# Patient Record
Sex: Female | Born: 1935 | Race: Black or African American | Hispanic: No | Smoking: Never smoker
Health system: Southern US, Community
[De-identification: ages and names within clinical notes are randomized; demographics above are authoritative.]

## PROBLEM LIST (undated history)

## (undated) DIAGNOSIS — I2699 Other pulmonary embolism without acute cor pulmonale: Secondary | ICD-10-CM

## (undated) DIAGNOSIS — E039 Hypothyroidism, unspecified: Secondary | ICD-10-CM

## (undated) DIAGNOSIS — I10 Essential (primary) hypertension: Secondary | ICD-10-CM

## (undated) DIAGNOSIS — I214 Non-ST elevation (NSTEMI) myocardial infarction: Secondary | ICD-10-CM

## (undated) DIAGNOSIS — K5790 Diverticulosis of intestine, part unspecified, without perforation or abscess without bleeding: Secondary | ICD-10-CM

## (undated) DIAGNOSIS — N189 Chronic kidney disease, unspecified: Secondary | ICD-10-CM

## (undated) DIAGNOSIS — R55 Syncope and collapse: Secondary | ICD-10-CM

## (undated) DIAGNOSIS — M069 Rheumatoid arthritis, unspecified: Secondary | ICD-10-CM

## (undated) DIAGNOSIS — R0789 Other chest pain: Secondary | ICD-10-CM

## (undated) DIAGNOSIS — K2211 Ulcer of esophagus with bleeding: Secondary | ICD-10-CM

## (undated) DIAGNOSIS — I350 Nonrheumatic aortic (valve) stenosis: Secondary | ICD-10-CM

## (undated) DIAGNOSIS — Z950 Presence of cardiac pacemaker: Secondary | ICD-10-CM

## (undated) DIAGNOSIS — D500 Iron deficiency anemia secondary to blood loss (chronic): Secondary | ICD-10-CM

## (undated) DIAGNOSIS — K922 Gastrointestinal hemorrhage, unspecified: Secondary | ICD-10-CM

## (undated) DIAGNOSIS — I509 Heart failure, unspecified: Secondary | ICD-10-CM

## (undated) DIAGNOSIS — I2510 Atherosclerotic heart disease of native coronary artery without angina pectoris: Secondary | ICD-10-CM

## (undated) DIAGNOSIS — F0390 Unspecified dementia without behavioral disturbance: Secondary | ICD-10-CM

## (undated) DIAGNOSIS — D126 Benign neoplasm of colon, unspecified: Secondary | ICD-10-CM

## (undated) DIAGNOSIS — I5032 Chronic diastolic (congestive) heart failure: Secondary | ICD-10-CM

## (undated) DIAGNOSIS — E785 Hyperlipidemia, unspecified: Secondary | ICD-10-CM

## (undated) DIAGNOSIS — R011 Cardiac murmur, unspecified: Secondary | ICD-10-CM

## (undated) HISTORY — DX: Other chest pain: R07.89

## (undated) HISTORY — DX: Atherosclerotic heart disease of native coronary artery without angina pectoris: I25.10

## (undated) HISTORY — DX: Other pulmonary embolism without acute cor pulmonale: I26.99

## (undated) HISTORY — DX: Syncope and collapse: R55

## (undated) HISTORY — DX: Chronic diastolic (congestive) heart failure: I50.32

## (undated) HISTORY — DX: Unspecified dementia, unspecified severity, without behavioral disturbance, psychotic disturbance, mood disturbance, and anxiety: F03.90

## (undated) HISTORY — DX: Hyperlipidemia, unspecified: E78.5

## (undated) HISTORY — DX: Nonrheumatic aortic (valve) stenosis: I35.0

## (undated) HISTORY — PX: JOINT REPLACEMENT: SHX530

## (undated) HISTORY — DX: Diverticulosis of intestine, part unspecified, without perforation or abscess without bleeding: K57.90

## (undated) HISTORY — DX: Gastrointestinal hemorrhage, unspecified: K92.2

## (undated) HISTORY — PX: OTHER SURGICAL HISTORY: SHX169

## (undated) HISTORY — DX: Ulcer of esophagus with bleeding: K22.11

## (undated) HISTORY — PX: KNEE ARTHROSCOPY: SUR90

## (undated) HISTORY — DX: Hypothyroidism, unspecified: E03.9

## (undated) HISTORY — DX: Benign neoplasm of colon, unspecified: D12.6

## (undated) HISTORY — DX: Iron deficiency anemia secondary to blood loss (chronic): D50.0

## (undated) HISTORY — DX: Essential (primary) hypertension: I10

## (undated) HISTORY — PX: CATARACT EXTRACTION: SUR2

## (undated) HISTORY — DX: Rheumatoid arthritis, unspecified: M06.9

---

## 2000-04-27 ENCOUNTER — Encounter: Payer: Self-pay | Admitting: Internal Medicine

## 2000-04-27 ENCOUNTER — Ambulatory Visit (HOSPITAL_COMMUNITY): Admission: RE | Admit: 2000-04-27 | Discharge: 2000-04-27 | Payer: Self-pay | Admitting: Internal Medicine

## 2000-12-04 ENCOUNTER — Inpatient Hospital Stay (HOSPITAL_COMMUNITY): Admission: EM | Admit: 2000-12-04 | Discharge: 2000-12-08 | Payer: Self-pay | Admitting: Emergency Medicine

## 2000-12-04 ENCOUNTER — Encounter: Payer: Self-pay | Admitting: Emergency Medicine

## 2000-12-07 ENCOUNTER — Encounter: Payer: Self-pay | Admitting: Internal Medicine

## 2000-12-08 ENCOUNTER — Encounter: Payer: Self-pay | Admitting: Internal Medicine

## 2000-12-18 ENCOUNTER — Encounter: Payer: Self-pay | Admitting: Internal Medicine

## 2000-12-18 ENCOUNTER — Ambulatory Visit (HOSPITAL_COMMUNITY): Admission: RE | Admit: 2000-12-18 | Discharge: 2000-12-18 | Payer: Self-pay | Admitting: Internal Medicine

## 2001-05-22 ENCOUNTER — Encounter: Admission: RE | Admit: 2001-05-22 | Discharge: 2001-05-22 | Payer: Self-pay | Admitting: Internal Medicine

## 2001-05-22 ENCOUNTER — Encounter: Payer: Self-pay | Admitting: Internal Medicine

## 2001-05-31 ENCOUNTER — Other Ambulatory Visit: Admission: RE | Admit: 2001-05-31 | Discharge: 2001-05-31 | Payer: Self-pay | Admitting: Obstetrics and Gynecology

## 2002-05-27 ENCOUNTER — Encounter: Payer: Self-pay | Admitting: Internal Medicine

## 2002-05-27 ENCOUNTER — Ambulatory Visit (HOSPITAL_COMMUNITY): Admission: RE | Admit: 2002-05-27 | Discharge: 2002-05-27 | Payer: Self-pay | Admitting: Internal Medicine

## 2003-02-04 ENCOUNTER — Ambulatory Visit (HOSPITAL_COMMUNITY): Admission: RE | Admit: 2003-02-04 | Discharge: 2003-02-04 | Payer: Self-pay | Admitting: Internal Medicine

## 2003-02-04 ENCOUNTER — Encounter: Payer: Self-pay | Admitting: Internal Medicine

## 2003-03-03 ENCOUNTER — Encounter: Payer: Self-pay | Admitting: Orthopedic Surgery

## 2003-03-03 ENCOUNTER — Ambulatory Visit (HOSPITAL_COMMUNITY): Admission: RE | Admit: 2003-03-03 | Discharge: 2003-03-03 | Payer: Self-pay | Admitting: Orthopedic Surgery

## 2004-12-27 ENCOUNTER — Ambulatory Visit (HOSPITAL_COMMUNITY): Admission: RE | Admit: 2004-12-27 | Discharge: 2004-12-27 | Payer: Self-pay | Admitting: Internal Medicine

## 2005-02-21 ENCOUNTER — Emergency Department (HOSPITAL_COMMUNITY): Admission: EM | Admit: 2005-02-21 | Discharge: 2005-02-21 | Payer: Self-pay | Admitting: Emergency Medicine

## 2005-02-28 ENCOUNTER — Emergency Department (HOSPITAL_COMMUNITY): Admission: EM | Admit: 2005-02-28 | Discharge: 2005-02-28 | Payer: Self-pay | Admitting: *Deleted

## 2005-03-05 ENCOUNTER — Emergency Department (HOSPITAL_COMMUNITY): Admission: EM | Admit: 2005-03-05 | Discharge: 2005-03-05 | Payer: Self-pay | Admitting: Emergency Medicine

## 2005-11-15 ENCOUNTER — Emergency Department (HOSPITAL_COMMUNITY): Admission: EM | Admit: 2005-11-15 | Discharge: 2005-11-15 | Payer: Self-pay | Admitting: Emergency Medicine

## 2006-06-13 ENCOUNTER — Emergency Department (HOSPITAL_COMMUNITY): Admission: EM | Admit: 2006-06-13 | Discharge: 2006-06-13 | Payer: Self-pay | Admitting: Emergency Medicine

## 2006-06-16 ENCOUNTER — Emergency Department (HOSPITAL_COMMUNITY): Admission: EM | Admit: 2006-06-16 | Discharge: 2006-06-16 | Payer: Self-pay | Admitting: Emergency Medicine

## 2007-02-28 ENCOUNTER — Ambulatory Visit (HOSPITAL_COMMUNITY): Admission: RE | Admit: 2007-02-28 | Discharge: 2007-02-28 | Payer: Self-pay | Admitting: Internal Medicine

## 2007-10-06 ENCOUNTER — Emergency Department (HOSPITAL_COMMUNITY): Admission: EM | Admit: 2007-10-06 | Discharge: 2007-10-06 | Payer: Self-pay | Admitting: Family Medicine

## 2008-06-14 ENCOUNTER — Emergency Department (HOSPITAL_COMMUNITY): Admission: EM | Admit: 2008-06-14 | Discharge: 2008-06-14 | Payer: Self-pay | Admitting: *Deleted

## 2008-06-26 ENCOUNTER — Ambulatory Visit (HOSPITAL_COMMUNITY): Admission: RE | Admit: 2008-06-26 | Discharge: 2008-06-26 | Payer: Self-pay | Admitting: Internal Medicine

## 2009-05-05 ENCOUNTER — Emergency Department (HOSPITAL_COMMUNITY): Admission: EM | Admit: 2009-05-05 | Discharge: 2009-05-05 | Payer: Self-pay | Admitting: Emergency Medicine

## 2009-05-07 ENCOUNTER — Inpatient Hospital Stay (HOSPITAL_COMMUNITY): Admission: EM | Admit: 2009-05-07 | Discharge: 2009-05-09 | Payer: Self-pay | Admitting: Emergency Medicine

## 2009-05-07 ENCOUNTER — Ambulatory Visit: Payer: Self-pay | Admitting: Cardiology

## 2009-05-08 ENCOUNTER — Encounter (INDEPENDENT_AMBULATORY_CARE_PROVIDER_SITE_OTHER): Payer: Self-pay | Admitting: Internal Medicine

## 2009-06-09 ENCOUNTER — Ambulatory Visit: Payer: Self-pay | Admitting: Diagnostic Radiology

## 2009-06-09 ENCOUNTER — Emergency Department (HOSPITAL_BASED_OUTPATIENT_CLINIC_OR_DEPARTMENT_OTHER): Admission: EM | Admit: 2009-06-09 | Discharge: 2009-06-09 | Payer: Self-pay | Admitting: Emergency Medicine

## 2009-07-21 HISTORY — PX: OTHER SURGICAL HISTORY: SHX169

## 2009-07-29 ENCOUNTER — Ambulatory Visit: Payer: Self-pay | Admitting: Internal Medicine

## 2009-07-29 ENCOUNTER — Inpatient Hospital Stay (HOSPITAL_COMMUNITY): Admission: EM | Admit: 2009-07-29 | Discharge: 2009-08-06 | Payer: Self-pay | Admitting: Emergency Medicine

## 2009-07-30 ENCOUNTER — Ambulatory Visit: Payer: Self-pay | Admitting: Vascular Surgery

## 2009-07-30 ENCOUNTER — Encounter (INDEPENDENT_AMBULATORY_CARE_PROVIDER_SITE_OTHER): Payer: Self-pay | Admitting: Internal Medicine

## 2009-08-03 ENCOUNTER — Ambulatory Visit: Payer: Self-pay | Admitting: Internal Medicine

## 2009-08-04 ENCOUNTER — Encounter (INDEPENDENT_AMBULATORY_CARE_PROVIDER_SITE_OTHER): Payer: Self-pay | Admitting: Internal Medicine

## 2009-08-04 ENCOUNTER — Encounter: Payer: Self-pay | Admitting: Internal Medicine

## 2009-08-06 ENCOUNTER — Encounter: Payer: Self-pay | Admitting: Internal Medicine

## 2009-08-07 ENCOUNTER — Encounter: Payer: Self-pay | Admitting: Cardiovascular Disease

## 2009-08-07 DIAGNOSIS — R55 Syncope and collapse: Secondary | ICD-10-CM

## 2009-08-07 DIAGNOSIS — M069 Rheumatoid arthritis, unspecified: Secondary | ICD-10-CM | POA: Insufficient documentation

## 2009-08-07 DIAGNOSIS — I359 Nonrheumatic aortic valve disorder, unspecified: Secondary | ICD-10-CM | POA: Insufficient documentation

## 2009-08-07 DIAGNOSIS — D500 Iron deficiency anemia secondary to blood loss (chronic): Secondary | ICD-10-CM

## 2009-08-07 DIAGNOSIS — I2699 Other pulmonary embolism without acute cor pulmonale: Secondary | ICD-10-CM

## 2009-08-07 DIAGNOSIS — K2211 Ulcer of esophagus with bleeding: Secondary | ICD-10-CM

## 2009-08-07 DIAGNOSIS — D126 Benign neoplasm of colon, unspecified: Secondary | ICD-10-CM

## 2009-08-07 DIAGNOSIS — I5032 Chronic diastolic (congestive) heart failure: Secondary | ICD-10-CM

## 2009-08-07 DIAGNOSIS — I10 Essential (primary) hypertension: Secondary | ICD-10-CM

## 2009-08-10 ENCOUNTER — Telehealth (INDEPENDENT_AMBULATORY_CARE_PROVIDER_SITE_OTHER): Payer: Self-pay | Admitting: *Deleted

## 2009-08-11 ENCOUNTER — Encounter: Payer: Self-pay | Admitting: Internal Medicine

## 2009-08-11 ENCOUNTER — Encounter: Payer: Self-pay | Admitting: Cardiovascular Disease

## 2009-08-11 LAB — CONVERTED CEMR LAB
POC INR: 2.9
Prothrombin Time: 20.6 s

## 2009-08-13 ENCOUNTER — Encounter (INDEPENDENT_AMBULATORY_CARE_PROVIDER_SITE_OTHER): Payer: Self-pay | Admitting: *Deleted

## 2009-08-16 ENCOUNTER — Encounter: Payer: Self-pay | Admitting: Internal Medicine

## 2009-08-16 ENCOUNTER — Inpatient Hospital Stay (HOSPITAL_COMMUNITY): Admission: EM | Admit: 2009-08-16 | Discharge: 2009-08-31 | Payer: Self-pay | Admitting: Emergency Medicine

## 2009-08-19 ENCOUNTER — Telehealth: Payer: Self-pay | Admitting: Internal Medicine

## 2009-08-28 ENCOUNTER — Encounter: Payer: Self-pay | Admitting: Internal Medicine

## 2009-08-31 ENCOUNTER — Encounter: Payer: Self-pay | Admitting: Internal Medicine

## 2009-09-01 ENCOUNTER — Telehealth (INDEPENDENT_AMBULATORY_CARE_PROVIDER_SITE_OTHER): Payer: Self-pay | Admitting: *Deleted

## 2009-09-08 ENCOUNTER — Ambulatory Visit: Payer: Self-pay | Admitting: Cardiology

## 2009-09-08 ENCOUNTER — Inpatient Hospital Stay (HOSPITAL_COMMUNITY): Admission: EM | Admit: 2009-09-08 | Discharge: 2009-09-09 | Payer: Self-pay | Admitting: Emergency Medicine

## 2009-09-09 ENCOUNTER — Encounter (INDEPENDENT_AMBULATORY_CARE_PROVIDER_SITE_OTHER): Payer: Self-pay | Admitting: Family Medicine

## 2009-09-09 ENCOUNTER — Encounter: Payer: Self-pay | Admitting: Internal Medicine

## 2009-09-10 ENCOUNTER — Encounter: Payer: Self-pay | Admitting: Cardiology

## 2009-09-18 ENCOUNTER — Encounter: Payer: Self-pay | Admitting: Internal Medicine

## 2009-09-18 ENCOUNTER — Encounter: Payer: Self-pay | Admitting: Cardiology

## 2009-09-21 ENCOUNTER — Ambulatory Visit: Payer: Self-pay | Admitting: Cardiology

## 2009-09-21 ENCOUNTER — Encounter: Payer: Self-pay | Admitting: Physician Assistant

## 2009-09-21 ENCOUNTER — Ambulatory Visit: Payer: Self-pay | Admitting: Internal Medicine

## 2009-09-21 DIAGNOSIS — F068 Other specified mental disorders due to known physiological condition: Secondary | ICD-10-CM | POA: Insufficient documentation

## 2009-09-21 DIAGNOSIS — R197 Diarrhea, unspecified: Secondary | ICD-10-CM | POA: Insufficient documentation

## 2009-09-21 DIAGNOSIS — R1319 Other dysphagia: Secondary | ICD-10-CM

## 2009-09-28 LAB — CONVERTED CEMR LAB: Vitamin B-12: 329 pg/mL (ref 211–911)

## 2009-10-05 ENCOUNTER — Encounter: Payer: Self-pay | Admitting: Internal Medicine

## 2009-10-06 ENCOUNTER — Ambulatory Visit (HOSPITAL_COMMUNITY): Admission: RE | Admit: 2009-10-06 | Discharge: 2009-10-06 | Payer: Self-pay | Admitting: Internal Medicine

## 2009-11-24 ENCOUNTER — Ambulatory Visit: Payer: Self-pay | Admitting: Internal Medicine

## 2009-11-24 DIAGNOSIS — R42 Dizziness and giddiness: Secondary | ICD-10-CM

## 2010-03-10 ENCOUNTER — Encounter: Payer: Self-pay | Admitting: Internal Medicine

## 2010-03-10 ENCOUNTER — Ambulatory Visit: Payer: Self-pay | Admitting: Internal Medicine

## 2010-03-30 IMAGING — CT CT ABD-PELV W/ CM
2 of 5 series · 17 of 46 positions shown, 19 images · IV contrast (APPLIED)
Comparison: 05/05/2009

CLINICAL DATA: Diffuse abdominal pain.  Vomiting.  Weakness.

CT ABDOMEN AND PELVIS WITH CONTRAST
TECHNIQUE: Multidetector CT imaging of the abdomen and pelvis was
performed following the standard protocol during bolus
administration of intravenous contrast.
Contrast: 100 ml [id number]

[Series 2: abd/pelvis 5.0 b31f · axial · 0.77mm/px · z∈[-366,-36]mm · 14 of 74 slices shown, 16 images]
[im 4/74  soft-tissue]
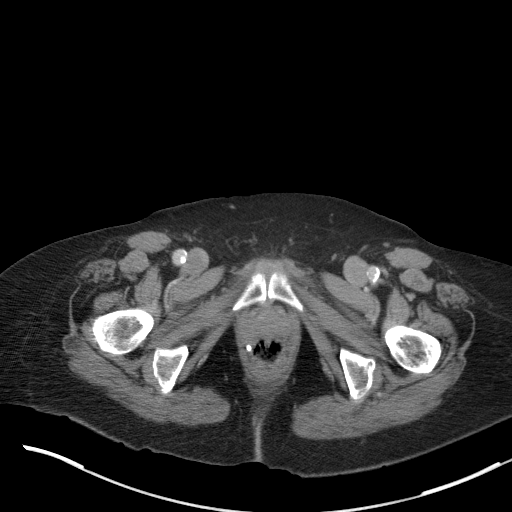
[im 4/74  bone]
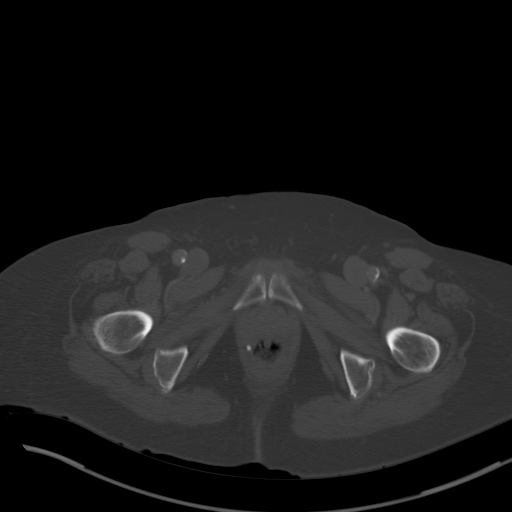
[im 11/74  soft-tissue]
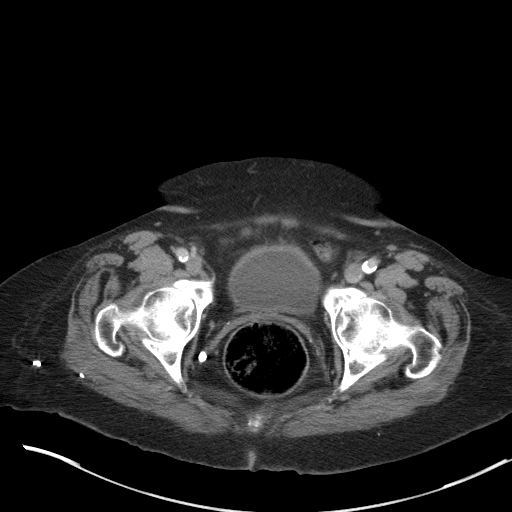
[im 14/74  soft-tissue]
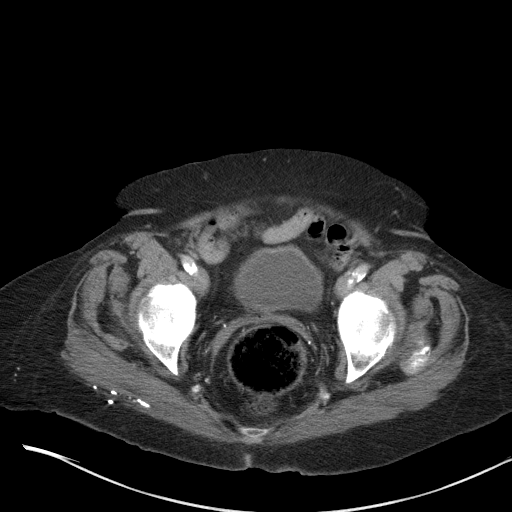
[im 21/74  soft-tissue]
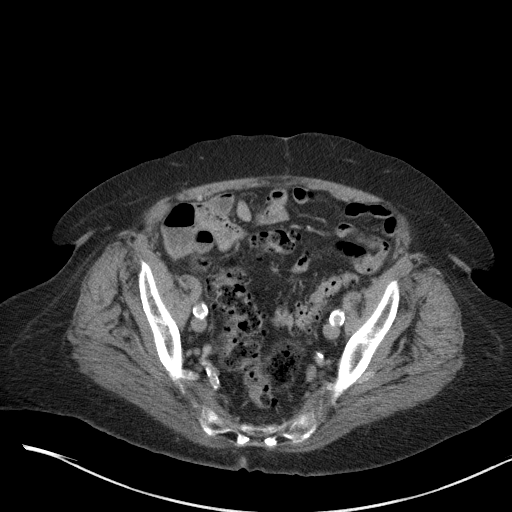
[im 25/74  soft-tissue]
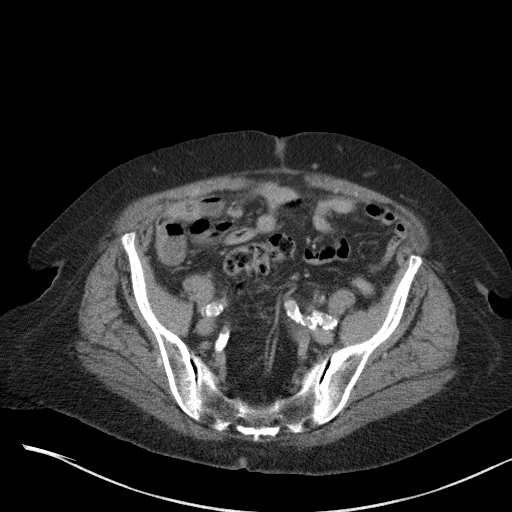
[im 28/74  soft-tissue]
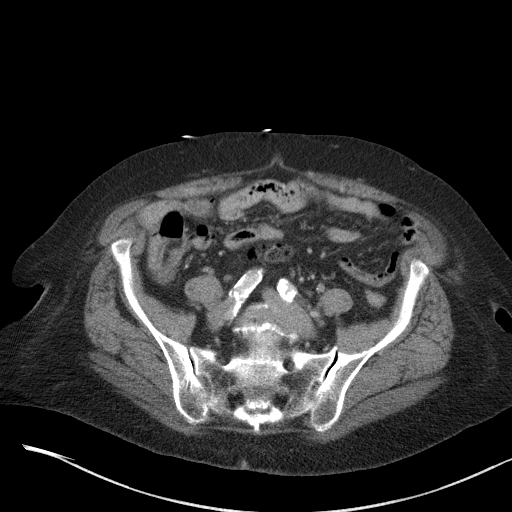
[im 35/74  soft-tissue]
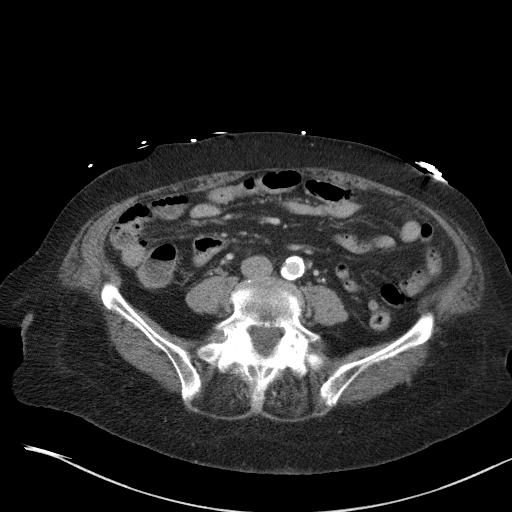
[im 39/74  soft-tissue]
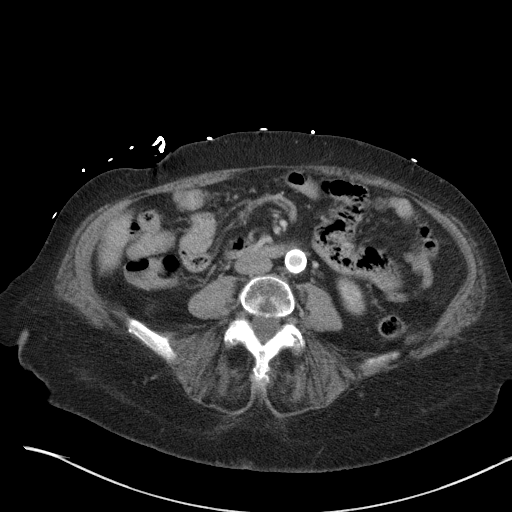
[im 46/74  soft-tissue]
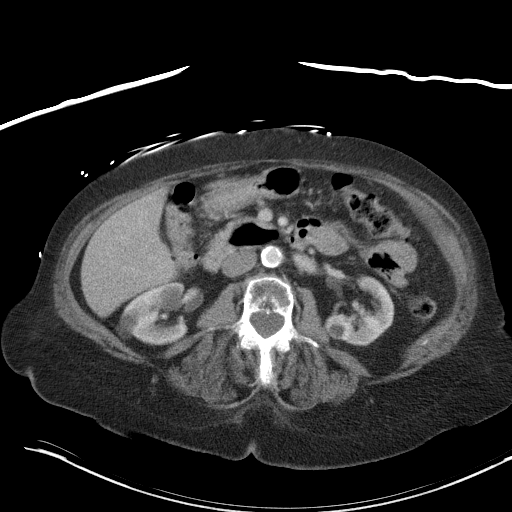
[im 46/74  bone]
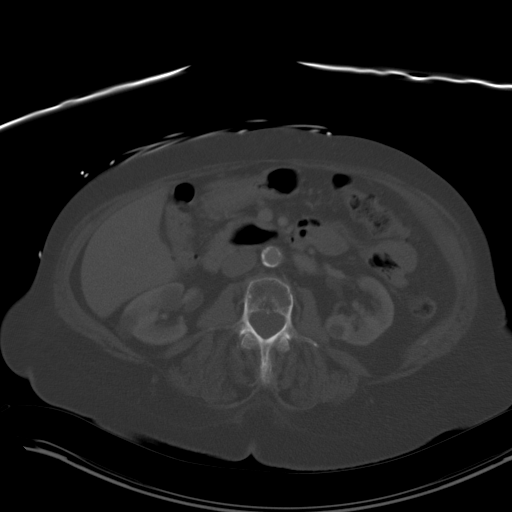
[im 49/74  soft-tissue]
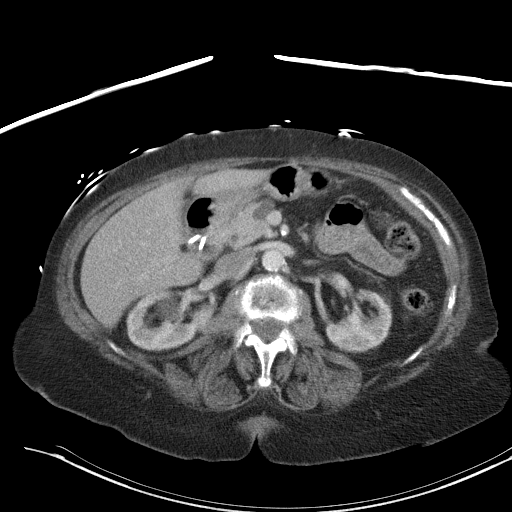
[im 56/74  soft-tissue]
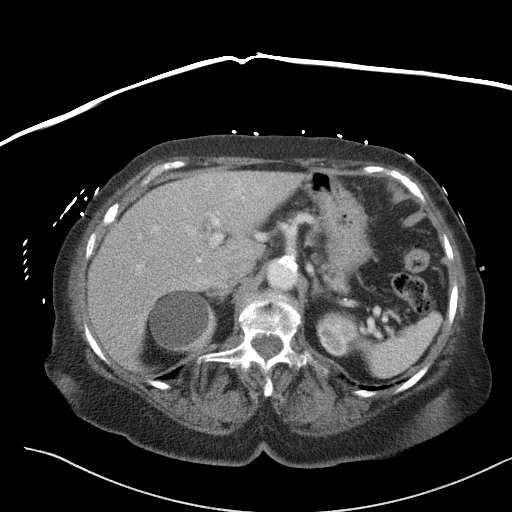
[im 60/74  soft-tissue]
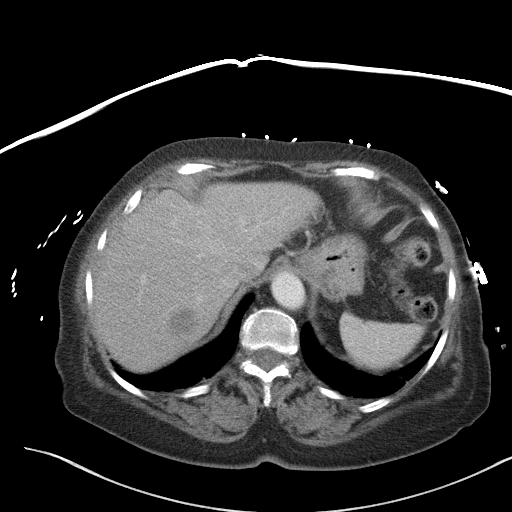
[im 63/74  soft-tissue]
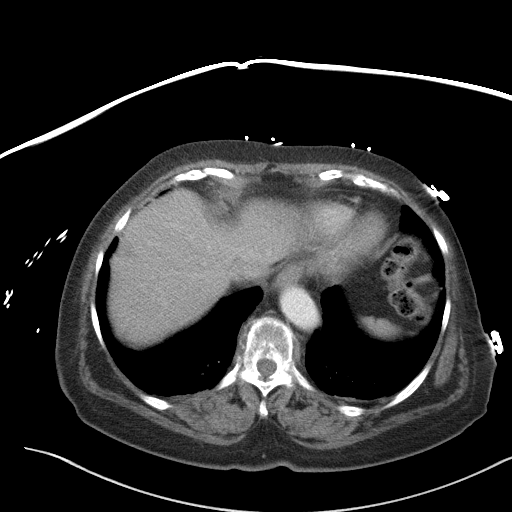
[im 70/74  soft-tissue]
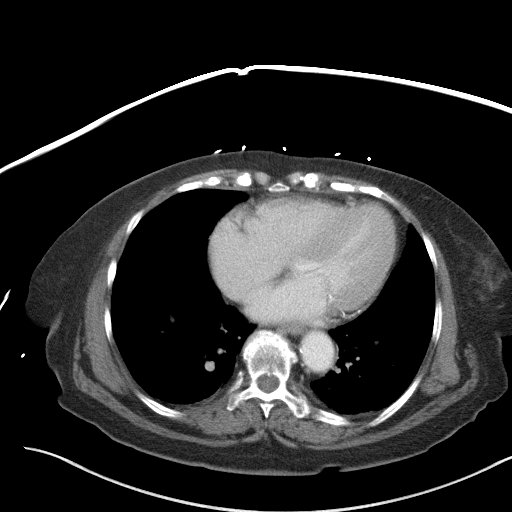

[Series 5: abd/pelvis 3.0 coronal · coronal · 0.76mm/px · 3 of 89 slices shown]
[im 30/89  soft-tissue]
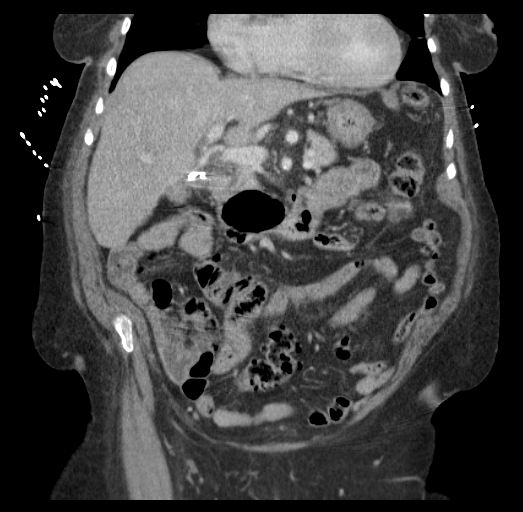
[im 40/89  soft-tissue]
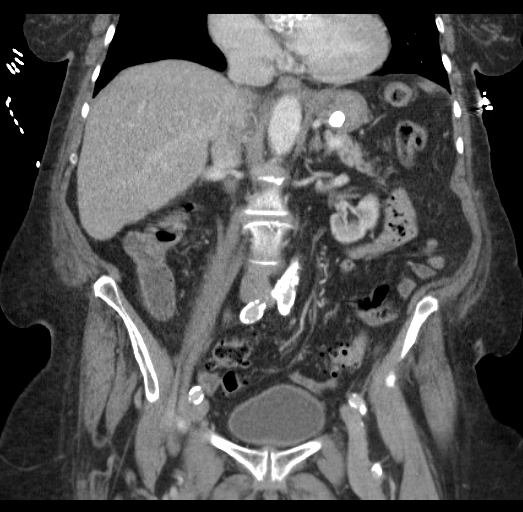
[im 49/89  soft-tissue]
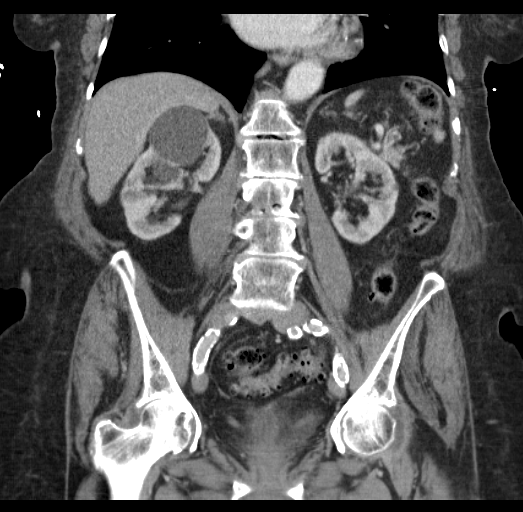

[17 of 46 positions shown; findings below may reference images not displayed]

FINDINGS: The liver, spleen, and adrenal glands are normal
appearance.  Bilateral renal cysts remains stable there is no
evidence of renal mass or hydronephrosis.

A small cystic lesion is again seen in the pancreatic body
measuring 1.8 x 0.9 cm.  This shows no significant change since
recent noncontrast CT.  No complex or enhancing soft tissue
component is seen there is no evidence of peripancreatic
inflammatory changes or pancreatic ductal dilatation.

No other soft tissue masses or adenopathy identified within the
abdomen or pelvis.  Previous hysterectomy noted.  Severe
diverticulosis of the sigmoid colon is seen, however there is no
evidence of diverticulitis or other inflammatory process.  No
abnormal fluid collections are seen.
IMPRESSION: 1.  No acute findings.
2.  1.8 cm simple appearing cystic lesion in the pancreatic body
with benign features.  Imaging followup is recommended in 12
months, preferably with abdomen MRI without and with contrast.
3. Sigmoid diverticulosis.  No evidence of diverticulitis.

This recommendation follows ACR consensus guidelines:  "Managing
Incidental Findings on Abdominal CT:  White Paper of the ACR
Incidental Findings Committee".  [HOSPITAL] [id number];[DATE]

## 2010-06-22 NOTE — Medication Information (Signed)
Summary: Coumadin Clinic  Anticoagulant Therapy  Managed by: Cloyde Reams, RN, BSN Referring MD: Dr Graciela Husbands PCP: Dr Margaretmary Bayley Supervising MD: Eden Emms MD, Theron Arista Indication 1: Pulmonary Embolism Lab Used: [facility] [place] Site: [address] PT 20.6 INR POC 2.9 INR RANGE 2 - 3    Bleeding/hemorrhagic complications: no     Any changes in medication regimen? no     Any missed doses?: no        Comments: Pt takes coumadin in am and has already taken today's dosage.    Allergies (verified): No Known Drug Allergies  Anticoagulation Management History:      Her anticoagulation is being managed by telephone today.  Positive risk factors for bleeding include an age of 75 years or older.  The bleeding index is 'intermediate risk'.  Positive CHADS2 values include History of CHF, History of HTN, and Age > 75 years old.  Prothrombin time is 20.6.  Anticoagulation responsible provider: Eden Emms MD, Theron Arista.  INR POC: 2.9.    Anticoagulation Management Assessment/Plan:      The patient's current anticoagulation dose is Warfarin sodium 5 mg tabs: Use as directed by Anticoagulation Clinic.  The target INR is 2.0-3.0.  The next INR is due 08/17/2009.  Anticoagulation instructions were given to caregiver in home.  Results were reviewed/authorized by Cloyde Reams, RN, BSN.  She was notified by Cloyde Reams RN.         Prior Anticoagulation Instructions: INR 1.8 Hospital dosing was:  3/12:  5 mg, 3/13: INR 0.98 dose=5 mg, 3/14: INR 1.08 no dose, INR 1.18 no dose, 3/16 INR 1.29 dose = 5 mg, 3/17 INR 1.43 dose = 5 mg.    Order to [facility] to give 5 mg today (3.18), continue lovenox two times a day, and recheck 3.19.  She will call result to on-call CARDS.      Current Anticoagulation Instructions: INR 2.9  Called spoke with pt's caregiver, advised to decr pt's dosage to 1 tablet daily except 1/2 tablet on Wednesdays.  Will recheck on 08/17/09. Called spoke with Noreene Larsson, [facility] advised  to recheck on 08/17/09.

## 2010-06-22 NOTE — Medication Information (Signed)
Summary: Coumadin Clinic  Anticoagulant Therapy  Managed by: Shelby Dubin, PharmD, BCPS, CPP Supervising MD: Clifton James MD, Cristal Deer Indication 1: Pulmonary Embolism Lab Used: [facility] [place] Site: [address] INR POC 1.8 INR RANGE 2 - 3  Dietary changes: no    Health status changes: no    Bleeding/hemorrhagic complications: no    Recent/future hospitalizations: yes       Details: d/c 3/17 s/p PE--  Any changes in medication regimen? yes       Details: d/cd home on warfarin 5 mg tabs and lovenox 85 mg Ada bid for 5 days.    Recent/future dental: no  Any missed doses?: no       Is patient compliant with meds? yes      Comments: call from [facility]--950 am 3/18.  Current Problems (verified): 1)  Chronic Diastolic Heart Failure  (ICD-428.32) 2)  Rheumatoid Arthritis  (ICD-714.0) 3)  Esophageal Ulcer, With Bleeding  (ICD-530.21) 4)  Pe  (ICD-415.19) 5)  Aortic Stenosis  (ICD-424.1) 6)  Essential Hypertension, Benign  (ICD-401.1) 7)  Tubulovillous Adenoma, Colon  (ICD-211.3) 8)  Iron Deficiency Anemia Secondary To Blood Loss  (ICD-280.0) 9)  Syncope  (ICD-780.2)  Current Medications (verified): 1)  Amlodipine Besylate 5 Mg Tabs (Amlodipine Besylate) .... Take One Tablet By Mouth Two Times A Day 2)  Lovenox 100 Mg/ml Soln (Enoxaparin Sodium) .... Inject 0.85 Mls Subcutaneously Into Abdomen Twice A Day--[facility] Administers. 3)  Protonix 40 Mg Tbec (Pantoprazole Sodium) .Marland Kitchen.. 1 By Mouth Two Times A Day Before Meals 4)  Warfarin Sodium 5 Mg Tabs (Warfarin Sodium) .... Use As Directed By Anticoagulation Clinic 5)  Accolate 20 Mg Tabs (Zafirlukast) .Marland Kitchen.. 1 By Mouth Two Times A Day 6)  Acetaminophen 500 Mg Caps (Acetaminophen) .... 2 Tabs By Mouth Two Times A Day 7)  Aricept 10 Mg Tabs (Donepezil Hcl) .Marland Kitchen.. 1 By Mouth Daily At Bedtime 8)  Lomotil 2.5-0.025 Mg Tabs (Diphenoxylate-Atropine) .Marland Kitchen.. 1 - 2 Tabs As Needed For Upset Stomach. 9)  Ferrous Sulfate 325 (65 Fe) Mg Tabs  (Ferrous Sulfate) .Marland Kitchen.. 1 By Mouth Daily 10)  Isosorbide Mononitrate Cr 30 Mg Xr24h-Tab (Isosorbide Mononitrate) .... Take One Tablet By Mouth Daily 11)  Labetalol Hcl 200 Mg Tabs (Labetalol Hcl) .... Take One Tablet By Mouth Twice A Day 12)  Furosemide 20 Mg Tabs (Furosemide) .... Take One Tablet By Mouth Every Other Day. 13)  Leflunomide 20 Mg Tabs (Leflunomide) .Marland Kitchen.. 1 By Mouth Daily. 14)  Lipitor 20 Mg Tabs (Atorvastatin Calcium) .... Take One Tablet By Mouth Daily At Bedtime. 15)  Potassium Chloride Crys Cr 20 Meq Cr-Tabs (Potassium Chloride Crys Cr) .... Take One Tablet By Mouth Every Other Day. 16)  Prednisone 5 Mg Tabs (Prednisone) .... 1.5 Tabs Daily. 17)  Synthroid 125 Mcg Tabs (Levothyroxine Sodium) .Marland Kitchen.. 1 By Mouth Daily. 18)  Systane 0.4-0.3 % Soln (Polyethyl Glycol-Propyl Glycol) .Marland Kitchen.. 1 Drop in Each Eye Daily As Needed.  Allergies (verified): No Known Drug Allergies  Anticoagulation Management History:      Her anticoagulation is being managed by telephone today.  Positive risk factors for bleeding include an age of 75 years or older.  The bleeding index is 'intermediate risk'.  Positive CHADS2 values include History of CHF, History of HTN, and Age > 17 years old.  Anticoagulation responsible provider: Clifton James MD, Cristal Deer.  INR POC: 1.8.    Anticoagulation Management Assessment/Plan:      The patient's current anticoagulation dose is Warfarin sodium 5  mg tabs: Use as directed by Anticoagulation Clinic.  The next INR is due 08/08/2009.  Results were reviewed/authorized by Shelby Dubin, PharmD, BCPS, CPP.  She was notified by Shelby Dubin PharmD, BCPS, CPP.         Current Anticoagulation Instructions: INR 1.8 Hospital dosing was:  3/12:  5 mg, 3/13: INR 0.98 dose=5 mg, 3/14: INR 1.08 no dose, INR 1.18 no dose, 3/16 INR 1.29 dose = 5 mg, 3/17 INR 1.43 dose = 5 mg.    Order to [facility] to give 5 mg today (3.18), continue lovenox two times a day, and recheck 3.19.  She will call result  to on-call CARDS.

## 2010-06-22 NOTE — Assessment & Plan Note (Signed)
Summary: eph/chest pain   Visit Type:  Follow-up Primary Provider:  Margaretmary Bayley, MD  CC:  chest pain, sob, dizziness, and edema in legs and feet.  History of Present Illness: This is an 77 year old, African American female with multiple medical problems, who is here for post hospital followup. She was recently diagnosed with severe aortic stenosis and is not a surgical candidate. She has history of syncope felt secondary to vasomotor problems, as well as recent GI bleed, and anemia. Dr. Graciela Husbands saw her in the hospital and thought she may need a loop recorder if she continues to have syncope. She has had trouble with bilateral pulmonary embolus treated with Coumadin. She then had a GI bleed on Coumadin and had an IVC filter placed. She is still on Coumadin because pulmonary recommended this but her INR was up to 5 last week. Dr. Margaretmary Bayley is managing this.  The patient's daughter stated that she's had 3 episodes  of near-syncope. Every morning, when her caregiver gives her a shower she comes out and becomes weak, dizzy, nauseated, and presyncopal. She never vomits and then the episode passes. This has happened 3 mornings in a row.  The patient complains of dyspnea on exertion.The patient also complains of right lower extremity edema and pain worse at night. The daughter, says she's complained of this ever since. The IVC filter was placed.  The patient also complains of left-sided chest pain. She describes it as a left sharp, shooting pain that occurs when she moves in a certain way.  Current Medications (verified): 1)  Amlodipine Besylate 5 Mg Tabs (Amlodipine Besylate) .... Take One Tablet By Mouth Two Times A Day 2)  Warfarin Sodium 5 Mg Tabs (Warfarin Sodium) .... Use As Directed By Anticoagulation Clinic Hold 3)  Accolate 20 Mg Tabs (Zafirlukast) .Marland Kitchen.. 1 By Mouth Two Times A Day 4)  Acetaminophen 500 Mg Caps (Acetaminophen) .... 2 Tabs By Mouth Two Times A Day 5)  Aricept 10 Mg Tabs  (Donepezil Hcl) .Marland Kitchen.. 1 By Mouth Daily At Bedtime 6)  Lomotil 2.5-0.025 Mg Tabs (Diphenoxylate-Atropine) .Marland Kitchen.. 1 - 2 Tabs As Needed For Upset Stomach. 7)  Ferrous Sulfate 325 (65 Fe) Mg Tabs (Ferrous Sulfate) .Marland Kitchen.. 1 By Mouth Daily 8)  Isosorbide Mononitrate Cr 30 Mg Xr24h-Tab (Isosorbide Mononitrate) .... Take One Tablet By Mouth Daily 9)  Labetalol Hcl 200 Mg Tabs (Labetalol Hcl) .... Take One Tablet By Mouth Twice A Day 10)  Furosemide 20 Mg Tabs (Furosemide) .... Take One Tablet By Mouth Every Other Day. 11)  Leflunomide 20 Mg Tabs (Leflunomide) .Marland Kitchen.. 1 By Mouth Daily. 12)  Lipitor 20 Mg Tabs (Atorvastatin Calcium) .... Take One Tablet By Mouth Daily At Bedtime. 13)  Potassium Chloride Crys Cr 20 Meq Cr-Tabs (Potassium Chloride Crys Cr) .... Take One Tablet By Mouth Every Other Day. 14)  Prednisone 5 Mg Tabs (Prednisone) .... 1.5 Tabs Daily. 15)  Synthroid 125 Mcg Tabs (Levothyroxine Sodium) .Marland Kitchen.. 1 By Mouth Daily. 16)  Systane 0.4-0.3 % Soln (Polyethyl Glycol-Propyl Glycol) .Marland Kitchen.. 1 Drop in Each Eye Daily As Needed. 17)  Nexium 40 Mg Cpdr (Esomeprazole Magnesium) .... Once Daily 18)  Vitamin D2 50000iu .... Weekly  Allergies: No Known Drug Allergies  Past History:  Past Medical History: Last updated: 09/21/2009 CHRONIC DIASTOLIC HEART FAILURE (ICD-428.32) RHEUMATOID ARTHRITIS (ICD-714.0) ESOPHAGEAL ULCER, WITH BLEEDING (ICD-530.21) 07/2009 Pulmonary Embolism AORTIC STENOSIS - Severe ESSENTIAL HYPERTENSION, BENIGN (ICD-401.1) TUBULOVILLOUS ADENOMA, COLON (ICD-211.3) IRON DEFICIENCY ANEMIA SECONDARY TO BLOOD LOSS (ICD-280.0) SYNCOPE (ICD-780.2) Musculoskeletal  chest pain Dementia Hypothyroidism Hyperlipidemia GI bleeds on Coumadin  Past Surgical History: Last updated: 09/21/2009 IVC filter 07/2009 Knee Arthroscopy Cataract Extraction  Review of Systems       see history of present illness  Vital Signs:  Patient profile:   77 year old female Height:      63 inches Weight:       177 pounds Pulse rate:   88 / minute Pulse (ortho):   90 / minute Pulse rhythm:   regular BP sitting:   142 / 80  (right arm) BP standing:   89 / 54  Vitals Entered By: Jacquelin Hawking, CMA (Sep 21, 2009 1:05 PM)  Serial Vital Signs/Assessments:  Time      Position  BP       Pulse  Resp  Temp     By 1:45      Lying RA  130/71   76                    Concetta Pfohl, CMA 1:45      Sitting   110/65   80                    Concetta Pfohl, CMA 1:45      Standing  89/54    90                    Concetta Pfohl, CMA  Comments: 1:45 pt was dizzy and weak upon standing. By: Jacquelin Hawking, CMA    Physical Exam  General:  Elderly, in no acute distress. Neck: slight increaseJVD, HJR, no Bruit, or thyroid enlargement Lungs: No tachypnea, clear without wheezing, rales, or rhonchi Cardiovascular: RRR, PMI not displaced, a harsh 3/6 systolic murmur at the right sternal border and left sternal border, decreased S2,no  gallops, bruit, thrill, or heave. Abdomen: BS normal. Soft without organomegaly, masses, lesions or tenderness. Extremities:+1 edema bilateral lower extremities, right greater than left, Good distal pulses bilateral SKin: Warm, no lesions or rashes  Musculoskeletal: No deformities Neuro: no focal signs    EKG  Procedure date:  09/21/2009  Findings:      normal sinus rhythm, with right bundle branch block  Impression & Recommendations:  Problem # 1:  SYNCOPE (ICD-780.2) This patient continues to have recurrent dizziness with presyncope especially after taking a shower or getting up from lying or sitting position. She was significantly orthostatic here in the office today. I am decreasing her labetalol to 100 mg b.i.d. and stopping her amlodipine. Her updated medication list for this problem includes:    Amlodipine Besylate 5 Mg Tabs (Amlodipine besylate) .Marland Kitchen... Take one tablet by mouth two times a day    Warfarin Sodium 5 Mg Tabs (Warfarin sodium) ..... Use as directed  by anticoagulation clinic hold    Isosorbide Mononitrate Cr 30 Mg Xr24h-tab (Isosorbide mononitrate) .Marland Kitchen... Take one tablet by mouth daily    Labetalol Hcl 200 Mg Tabs (Labetalol hcl) .Marland Kitchen... Take one tablet by mouth twice a day    Prednisone 5 Mg Tabs (Prednisone) .Marland Kitchen... 1.5 tabs daily.  The following medications were removed from the medication list:    Amlodipine Besylate 5 Mg Tabs (Amlodipine besylate) .Marland Kitchen... Take one tablet by mouth two times a day Her updated medication list for this problem includes:    Warfarin Sodium 5 Mg Tabs (Warfarin sodium) ..... Use as directed by anticoagulation clinic hold    Isosorbide Mononitrate Cr 30 Mg Xr24h-tab (  Isosorbide mononitrate) .Marland Kitchen... Take one tablet by mouth daily    Labetalol Hcl 200 Mg Tabs (Labetalol hcl) .Marland Kitchen... Take 1/2  tablet by mouth twice a day    Prednisone 5 Mg Tabs (Prednisone) .Marland Kitchen... 1.5 tabs daily.  The following medications were removed from the medication list:    Amlodipine Besylate 5 Mg Tabs (Amlodipine besylate) .Marland Kitchen... Take one tablet by mouth two times a day Her updated medication list for this problem includes:    Warfarin Sodium 5 Mg Tabs (Warfarin sodium) ..... Use as directed by anticoagulation clinic hold    Isosorbide Mononitrate Cr 30 Mg Xr24h-tab (Isosorbide mononitrate) .Marland Kitchen... Take one tablet by mouth daily    Labetalol Hcl 200 Mg Tabs (Labetalol hcl) .Marland Kitchen... Take one tablet by mouth twice a day    Prednisone 5 Mg Tabs (Prednisone) .Marland Kitchen... 1.5 tabs daily.  Problem # 2:  CHRONIC DIASTOLIC HEART FAILURE (ICD-428.32) Patient does have some leg edema, but lungs are clear and reluctant to increase her Lasix as she is orthostatic.on helping some of the edema will decrease with stopping her amlodipine. The following medications were removed from the medication list:    Amlodipine Besylate 5 Mg Tabs (Amlodipine besylate) .Marland Kitchen... Take one tablet by mouth two times a day Her updated medication list for this problem includes:    Warfarin  Sodium 5 Mg Tabs (Warfarin sodium) ..... Use as directed by anticoagulation clinic hold    Isosorbide Mononitrate Cr 30 Mg Xr24h-tab (Isosorbide mononitrate) .Marland Kitchen... Take one tablet by mouth daily    Labetalol Hcl 200 Mg Tabs (Labetalol hcl) .Marland Kitchen... Take 1/2  tablet by mouth twice a day    Furosemide 20 Mg Tabs (Furosemide) .Marland Kitchen... Take one tablet by mouth every other day.  Orders: EKG w/ Interpretation (93000)  The following medications were removed from the medication list:    Amlodipine Besylate 5 Mg Tabs (Amlodipine besylate) .Marland Kitchen... Take one tablet by mouth two times a day Her updated medication list for this problem includes:    Warfarin Sodium 5 Mg Tabs (Warfarin sodium) ..... Use as directed by anticoagulation clinic hold    Isosorbide Mononitrate Cr 30 Mg Xr24h-tab (Isosorbide mononitrate) .Marland Kitchen... Take one tablet by mouth daily    Labetalol Hcl 200 Mg Tabs (Labetalol hcl) .Marland Kitchen... Take 1/2  tablet by mouth twice a day    Furosemide 20 Mg Tabs (Furosemide) .Marland Kitchen... Take one tablet by mouth every other day.  Problem # 3:  AORTIC STENOSIS (ICD-424.1) Patient has severe aortic stenosis, but is nonoperative candidate Her updated medication list for this problem includes:    Isosorbide Mononitrate Cr 30 Mg Xr24h-tab (Isosorbide mononitrate) .Marland Kitchen... Take one tablet by mouth daily    Labetalol Hcl 200 Mg Tabs (Labetalol hcl) .Marland Kitchen... Take 1/2  tablet by mouth twice a day    Furosemide 20 Mg Tabs (Furosemide) .Marland Kitchen... Take one tablet by mouth every other day.  Problem # 4:  PE (ICD-415.19) Patient has bilateral pulmonary embolus, and recently had an IVC filter placed because of a GI bleed on Coumadin. Pulmonary recommends continuing Coumadin, but they're having trouble adjusting this. This is being followed by Dr. Margaretmary Bayley. INR was 5. Last week, and she is having it rechecked on Wednesday. Her updated medication list for this problem includes:    Warfarin Sodium 5 Mg Tabs (Warfarin sodium) ..... Use as  directed by anticoagulation clinic hold  Patient Instructions: 1)  Your physician has recommended you make the following change in your medication: Stop Amlodipine,  and  decrease Labetalol to 1/2 tablet twice a day. 2)  Your physician recommends that you schedule a follow-up appointment in: 2 weeks with Dr. Graciela Husbands.

## 2010-06-22 NOTE — Miscellaneous (Signed)
Summary: [facility]  [facility]   Imported By: Roderic Ovens 09/30/2009 11:11:44  _____________________________________________________________________  External Attachment:    Type:   Image     Comment:   External Document

## 2010-06-22 NOTE — Procedures (Signed)
Summary: Upper Endoscopy  Patient: [name] Note: All result statuses are Final unless otherwise noted.  Tests: (1) Upper Endoscopy (EGD)   EGD Upper Endoscopy       DONE     [facility]     [address]     [place], [place]  [address]           ENDOSCOPY PROCEDURE REPORT           PATIENT:  Penny, Tran  MR#:  [id number]     BIRTHDATE:  [birth year], 89 yrs. old  GENDER:  female           ENDOSCOPIST:  Iva Boop, MD, [facility]     Referred by:  [facility]           PROCEDURE DATE:  08/04/2009     PROCEDURE:  EGD with biopsy     ASA CLASS:  Class III     INDICATIONS:  FOBT + stool, iron deficiency anemia awaiting     warfarin with new bilateral PE's           MEDICATIONS:   There was residual sedation effect present from     prior procedure., Fentanyl 10 mcg IV, Versed 1 mg IV     TOPICAL ANESTHETIC:  Cetacaine Spray           DESCRIPTION OF PROCEDURE:   After the risks benefits and     alternatives of the procedure were thoroughly explained, informed     consent was obtained.  The [id number] ([id number]) and [id number]     ([id number]) endoscope was introduced through the mouth and advanced     to the second portion of the duodenum, without limitations.  The     instrument was slowly withdrawn as the mucosa was fully examined.     <<PROCEDUREIMAGES>>           An ulcer was found in the distal esophagus. approximately 2 cm     with adherent clots on the distal and proximal aspects, no     bleeding. With standard forceps, a biopsy was obtained and sent to     pathology. Biopsy of clean-based area. A submucosal nodule/bulge     in proximal stomach. About.15 cm max.  Otherwise the examination     was normal.    Retroflexed views revealed Retroflexion exam     demonstrated findings as previously described.    The scope was     then withdrawn from the patient and the procedure completed.           COMPLICATIONS:  None           ENDOSCOPIC  IMPRESSION:     1) Ulcer in the distal esophagus - probably a Mallory-weiss tear     as she told me she did vomit with the colonoscopy prep. There are     adherent clots but no current bleeding and with motion in the     esophagus and some difficulty desating her I did not attempt     therapy (most of these heal spontaneously).     2) Submucosal nodule/bulge in proximal stomach - (nothing seen     in this area on Jan 2011 CT abd)     3) Otherwise normal examination     RECOMMENDATIONS:     1) Lovenox but not warfarin yet     2) will discuss plans but need to see colon polyp pathology.  3) Bid PPI for now           REPEAT EXAM:  as needed           Iva Boop, MD, [facility]           CC:  Margaretmary Bayley, MD     The Patient           n.     eSIGNED:   Iva Boop at 08/04/2009 03:31 PM           Irene Shipper, [id number]  Note: An exclamation mark (!) indicates a result that was not dispersed into the flowsheet. Document Creation Date: 08/04/2009 3:40 PM _______________________________________________________________________  (1) Order result status: Final Collection or observation date-time: 08/04/2009 15:20 Requested date-time:  Receipt date-time:  Reported date-time:  Referring Physician:   Ordering Physician: Stan Head [phone]) Specimen Source:  Source: Launa Grill Order Number: [id number] Lab site:

## 2010-06-22 NOTE — Miscellaneous (Signed)
Summary: [facility]  [facility]   Imported By: Roderic Ovens 09/03/2009 15:23:48  _____________________________________________________________________  External Attachment:    Type:   Image     Comment:   External Document

## 2010-06-22 NOTE — Letter (Signed)
Summary: New Patient letter  [facility]  [address] [place], [place] [address]   Phone: [phone]  Fax: [phone]       08/13/2009 MRN: [id number]  [facility] [address] [place], [place]  [address]  [place]  Dear Ms. Muldrow,  Welcome to the Gastroenterology Division at [facility].    You are scheduled to see Dr. Leone Payor on 09-21-09 at 10:30a.m. on the 3rd floor at [facility], [address].  We ask that you try to arrive at our office 15 minutes prior to your appointment time to allow for check-in.  We would like you to complete the enclosed self-administered evaluation form prior to your visit and bring it with you on the day of your appointment.  We will review it with you.  Also, please bring a complete list of all your medications or, if you prefer, bring the medication bottles and we will list them.  Please bring your insurance card so that we may make a copy of it.  If your insurance requires a referral to see a specialist, please bring your referral form from your primary care physician.  Co-payments are due at the time of your visit and may be paid by cash, check or credit card.     Your office visit will consist of a consult with your physician (includes a physical exam), any laboratory testing he/she may order, scheduling of any necessary diagnostic testing (e.g. x-ray, ultrasound, CT-scan), and scheduling of a procedure (e.g. Endoscopy, Colonoscopy) if required.  Please allow enough time on your schedule to allow for any/all of these possibilities.    If you cannot keep your appointment, please call [phone] to cancel or reschedule prior to your appointment date.  This allows [place] the opportunity to schedule an appointment for another patient in need of care.  If you do not cancel or reschedule by 5 p.m. the business day prior to your appointment date, you will be charged a $50.00 late cancellation/no-show fee.    Thank you for choosing  [facility] for your medical needs.  We appreciate the opportunity to care for you.  Please visit [place] at our website  to learn more about our practice.                     Sincerely,                                                             The Gastroenterology Division

## 2010-06-22 NOTE — Assessment & Plan Note (Signed)
Summary: rov/sl   Primary Provider:  Margaretmary Bayley, MD  CC:  rov.  Pt reports dizziness.  This is frequent.  .  History of Present Illness: [name] is seen following a hospitalization in March for syncope occurring in the context of severe inoperable aortic stenosis with an aortic valve area of 0.6 and hypertension. It was my impression that she had vasomotor/orthostatic syncope. Her symptoms are somewhat ameliorated by reduction in blood pressure. the records were reviewed  The hospitalization was also complicated by DVT for which he was put on Coumadin. Management of this has been quite nightmarish with INRs ranging from 1-5. The plan is to discontinue it next week.  she also has significant and recurrent problems with orthostatic lightheadedness   She also has severe dementia.  Current Medications (verified): 1)  Warfarin Sodium 3 Mg Tabs (Warfarin Sodium) .... Take 1 Tablet On Tues, Thurs, Sat and Sun.  Take 2 Tablets All Other Days 2)  Accolate 20 Mg Tabs (Zafirlukast) .Marland Kitchen.. 1 By Mouth Two Times A Day 3)  Acetaminophen 500 Mg Caps (Acetaminophen) .... 2 Tabs By Mouth Two Times A Day 4)  Aricept 10 Mg Tabs (Donepezil Hcl) .Marland Kitchen.. 1 By Mouth Daily At Bedtime 5)  Lomotil 2.5-0.025 Mg Tabs (Diphenoxylate-Atropine) .Marland Kitchen.. 1 - 2 Tabs As Needed For Upset Stomach. 6)  Ferrous Sulfate 325 (65 Fe) Mg Tabs (Ferrous Sulfate) .Marland Kitchen.. 1 By Mouth Daily 7)  Isosorbide Mononitrate Cr 30 Mg Xr24h-Tab (Isosorbide Mononitrate) .... Take One Tablet By Mouth Daily 8)  Labetalol Hcl 200 Mg Tabs (Labetalol Hcl) .... Take 1/2  Tablet By Mouth Twice A Day 9)  Furosemide 20 Mg Tabs (Furosemide) .... Take One Tablet By Mouth Every Other Day. 10)  Leflunomide 20 Mg Tabs (Leflunomide) .Marland Kitchen.. 1 By Mouth Daily. 11)  Lipitor 20 Mg Tabs (Atorvastatin Calcium) .... Take One Tablet By Mouth Daily At Bedtime. 12)  Potassium Chloride Crys Cr 20 Meq Cr-Tabs (Potassium Chloride Crys Cr) .... Take One Tablet By Mouth Every Other  Day. 13)  Prednisone 5 Mg Tabs (Prednisone) .... 1.5 Tabs Daily. 14)  Synthroid 125 Mcg Tabs (Levothyroxine Sodium) .Marland Kitchen.. 1 By Mouth Daily. 15)  Systane 0.4-0.3 % Soln (Polyethyl Glycol-Propyl Glycol) .Marland Kitchen.. 1 Drop in Each Eye Daily As Needed. 16)  Nexium 40 Mg Cpdr (Esomeprazole Magnesium) .... Once Daily 17)  Vitamin D2 50000iu .... Weekly 18)  Vitamin C Cr 500 Mg Cr-Caps (Ascorbic Acid) .... Take One Capsule Once Daily 19)  Diphenoxylate-Atropine 2.5-0.025 Mg Tabs (Diphenoxylate-Atropine) .... Uad As Needed For Loose Stools  Allergies (verified): No Known Drug Allergies  Past History:  Past Medical History: Last updated: 09/21/2009 CHRONIC DIASTOLIC HEART FAILURE (ICD-428.32) RHEUMATOID ARTHRITIS (ICD-714.0) ESOPHAGEAL ULCER, WITH BLEEDING (ICD-530.21) 07/2009 Pulmonary Embolism AORTIC STENOSIS - Severe ESSENTIAL HYPERTENSION, BENIGN (ICD-401.1) TUBULOVILLOUS ADENOMA, COLON (ICD-211.3) IRON DEFICIENCY ANEMIA SECONDARY TO BLOOD LOSS (ICD-280.0) SYNCOPE (ICD-780.2) Musculoskeletal chest pain Dementia Hypothyroidism Hyperlipidemia GI bleeds on Coumadin  Past Surgical History: Last updated: 09/21/2009 IVC filter 07/2009 Knee Arthroscopy Cataract Extraction  Family History: Last updated: 09/21/2009 Family History of Breast Cancer:Daughter Family History of Esophageal Cancer:Daughter Family History of Prostate Cancer:Nephew Family History of Diabetes: daughter Family History of Heart Disease:   Social History: Last updated: 09/18/2009 The patient denies any smoking or any drug use.  She   currently lives at home with the caregiver and usually ambulates with a   walker.   Vital Signs:  Patient profile:   77 year old female Height:  63 inches Weight:      182 pounds BMI:     32.36 Pulse rate:   73 / minute Pulse rhythm:   regular BP sitting:   150 / 77  (right arm) Cuff size:   regular  Vitals Entered By: Judithe Modest CMA (November 24, 2009 11:17  AM)   Physical Exam  General:  Well developed, well nourished African American female appearing her stated age in no acute distress. Head:  normal HEENT Neck:  supple carotids were delayed Lungs:  clear Heart:  regular rate and rhythm with a 3/6 crescendo decrescendo murmur along theleft sternal border with a single S2 Abdomen:  soft and nontender Extremities:  without significant edema but tenderness Neurologic:  alert and oriented x1   Impression & Recommendations:  Problem # 1:  ORTHOSTATIC DIZZINESS (ICD-780.4) the patient has orthostatic dizziness. Her systolic blood pressure 100/50; her intracavitary pressure is way over 200. However, I think that her lightheadedness and the potential for fall is a greater risk assess and to stop her Imdur  Problem # 2:  AORTIC STENOSIS (ICD-424.1) She is not a candidate for surgery; I reviewed the prognosis with the daughter Her updated medication list for this problem includes:    Isosorbide Mononitrate Cr 30 Mg Xr24h-tab (Isosorbide mononitrate) .Marland Kitchen... Take one tablet by mouth daily    Labetalol Hcl 200 Mg Tabs (Labetalol hcl) .Marland Kitchen... Take 1/2  tablet by mouth twice a day    Furosemide 20 Mg Tabs (Furosemide) .Marland Kitchen... Take one tablet by mouth every other day.  Problem # 3:  ESSENTIAL HYPERTENSION, BENIGN (ICD-401.1) as above Her updated medication list for this problem includes:    Labetalol Hcl 200 Mg Tabs (Labetalol hcl) .Marland Kitchen... Take 1/2  tablet by mouth twice a day    Furosemide 20 Mg Tabs (Furosemide) .Marland Kitchen... Take one tablet by mouth every other day.  Patient Instructions: 1)  Your physician has recommended you make the following change in your medication:   Discontinue Isosorbide Mononitrate.    2)  Your physician recommends that you schedule a follow-up appointment in:   3 months

## 2010-06-22 NOTE — Discharge Summary (Signed)
Summary: Resolved GI Bleed/Syncope    [name], [name]              ACCOUNT NO.:  [id number]      MEDICAL RECORD NO.:  [id number]          PATIENT TYPE:  INP      LOCATION:  6709                         FACILITY:  [facility]      PHYSICIAN:  Clydia Llano, MD       DATE OF BIRTH:  1918-02-29      DATE OF ADMISSION:  08/16/2009   DATE OF DISCHARGE:                                  DISCHARGE SUMMARY      REASON FOR ADMISSION:  Passed out.      DISCHARGE DIAGNOSES:   1. Gastrointestinal bleed, resolved.   2. Recent pulmonary embolism status post inferior vena cava filter,       started back on heparin and Coumadin.  The patient is going to be       discharged home on Lovenox and Coumadin.   3. Hypertension.   4. History of peptic ulcer disease.   5. Severe aortic stenosis.   6. Rheumatoid arthritis.   7. Hypothyroidism.   8. Dementia.   9. Chronic diastolic congestive heart failure.   10.Hyperlipidemia.      PROCEDURES:   1. Attempted IVC filter placement on March 30 which failed because of       IVC thrombosis distal renal vein.   2. Successful placement of IVC filter April 14.      BRIEF HISTORY EXAMINATION:  [name] is an 77 year old woman with   history of severe aortic stenosis, rheumatoid arthritis on chronic   prednisone, with hypothyroidism.  Patient was brought to the emergency   room by her caregiver after she passed out at home.  According to the   care giver, the patient was just not herself for the past couple of   days.  She was taken her primary care physician. She was found to   probably have bronchitis.  The patient was started on an unspecified   antibiotic, and she was just not eating and drinking normally.  Today   the patient was in bed.  When she attempted to get up, she completely   passed out and fell down.  She did not have any significant trauma. The   caregiver caught her before she fell to the floor.  [name] is now   alert, weak.  [name]  at time of admission was awake, alert, looking   around, denying any pain, mentioning she has absolutely no recollection   of the event.  The patient was evaluated in the in the ER and found to   have anemia and GI bleeding.  The patient was admitted to the hospital   for further evaluation      DISCHARGE MEDICATIONS:   1. Lovenox 120 mg p.o. for 7 more days.   2. Acetaminophen 500 mg 2 tablets by mouth daily.   3. Accolate 20 mg p.o. b.i.d.   4. Amlodipine 5 mg p.o. b.i.d.   5. Aricept 10 mg p.o. at bedtime.   6. Complex vitamin C time release over-the-counter 1 tablet b.i.d.  7. Diphenoxylate/atropine 2.5/025 mg 1-2 tablets by mouth daily after       each loose stool,   8. Ferrous sulfate 325 mg p.o. daily.   9. Isosorbide mononitrate 30 mg p.o. daily.   10.Labetalol 200 mg p.o. b.i.d.   11.Lasix 20 mg every day as needed for leg swelling.   12.Leflunomide 20 mg p.o. daily.   13.Lipitor 20 mg p.o. nightly.   14.Pantoprazole 40 mg p.o. b.i.d. before meals.   15.Potassium chloride 20 mg p.o. every other day.   16.Prednisone 7.5 mg p.o. daily.   17.Synthroid 125 mcg p.o. daily.   18.Systane eye drops over-the-counter 1 drop both eyes daily as needed       for dry eye.   19.Vitamin D2, 50,000 units weekly, takes on Sundays.   20.Coumadin 6 mg p.o. daily.      BRIEF HOSPITAL COURSE:   #1.  GASTROINTESTINAL BLEED.  This patient now presented with a   gastrointestinal bleed.  The patient's Coumadin was stopped, and GI   bleeding stopped also.  As per gastroenterology recommendation on March   28, the patient was sent for possible filter placement.  Coumadin was   held, and the patient's  hemoglobin and hematocrit remained stable.  The   patient has been on IV heparin protocol since she has been in the   hospital.  Hemoglobin and hematocrit have remained stable.  The patient   was started back on the Coumadin , and she will be discharged home on   Lovenox and Coumadin, and we will  watch that.      #2.  PULMONARY EMBOLISM:  Discussed with pulmonary. Recommendation is   that even though the patient has an IVC filter, the patient should be on   Coumadin.  Though she has had an IVC filter placed, she needs the   treatment with the Coumadin.  If the patient starts to bleed, the risks   and benefits can be weighed, and at that time, the Coumadin can be   stopped because of the complications.      #3.  HYPERTENSION:  The patient restarted her antihypertensive   medications including amlodipine, labetalol, and isosorbide mononitrate.   Blood pressure is stable as the patient is not bleeding.      #4.  DEMENTIA:  The patient is currently on Aricept, and she is very   stable. No complication during hospitalization.      #5.  SEVERE AORTIC STENOSIS:  The patient has severe aortic stenosis   with aortic valve surface area of 0.99 cm2, and the mean gradient is 46   mmHg.  The patient will follow up as usual with Dr. Graciela Husbands from [facility]   Cardiology      #6.  CHRONIC COUMADIN THERAPY:  The patient will be on chronic Coumadin   for 3-6 months. Her INR should be 2-3.  The patient was restarted on   Coumadin. Dr Margaretmary Bayley is going to follow up on the Coumadin.   The home health service, which is [facility], is  going to draw   the levels from home.               Clydia Llano, MD            ME/MEDQ  D:  08/31/2009  T:  08/31/2009  Job:  [id number]      cc:   Margaretmary Bayley, M.D.   Duke Salvia, MD, [facility]  Electronically Signed by Clydia Llano  on 09/04/2009 04:29:14 PM

## 2010-06-22 NOTE — Discharge Summary (Signed)
Summary: GI Bleed      [name], [name]              ACCOUNT NO.:  [id number]      MEDICAL RECORD NO.:  [id number]          PATIENT TYPE:  INP      LOCATION:  3738                         FACILITY:  [facility]      PHYSICIAN:  Penny Nephew, MD       DATE OF BIRTH:  February 06, 1918      DATE OF ADMISSION:  07/29/2009   DATE OF DISCHARGE:  08/06/2009                                  DISCHARGE SUMMARY      PRIMARY CARE PHYSICIAN:  Dr. Iva Boop.      CARDIOLOGIST:  Dr. Sherryl Manges of [facility]      DISCHARGE DIAGNOSIS:   1. Recurrent syncope, multifactorial.   2. Iron-deficiency anemia.   3. Colon polyp.   4. Tubular adenoma.   5. Hypertension, controlled.   6. Severe aortic stenosis, probably not surgical candidate.   7. Bilateral pulmonary emboli on Lovenox and Coumadin.   8. Distal esophageal ulcer, probably Mallory-Weiss tear.   9. Rheumatoid arthritis.   10.Diastolic congestive heart failure.  No beta blockers and ace       inhibitors secondary to severe aortic stenosis, altered mental    status.      DISCHARGE MEDICATIONS:   1. Amlodipine 5 mg p.o. b.i.d.   2. Lovenox 85 mg subcutaneously twice daily for 5 days.  Home health       to administer.   3. Protonix 40 mg p.o. b.i.d.   4. Warfarin 5 mg p.o. daily.   5. Zafirlukast 20 mg p.o. b.i.d.   6. Acetaminophen 1000 mg p.o. b.i.d.   7. Aricept 10 mg p.o. daily at bedtime.   8. Complex vitamin C tablets 1 tablet by mouth twice daily.   9. Diphenoxylate atropine 2.5/0.025 mg 1-2 tablets by mouth as needed       for upset stomach.   10.Ferrous sulfate 325 mg p.o. daily.   11.Sorbide mononitrate 30 mg p.o. daily.   12.Labetalol 200 mg p.o. b.i.d.   13.Lasix 20 mg p.o. every other day p.r.n. leg swelling.   14.Leflunomide 20 mg 1 tablet by mouth daily.   15.Lipitor 20 mg p.o. daily at bedtime.   16.Potassium chloride 20 mEq 1 tablet by mouth every other day. Take       when he takes Lasix.   17.Prednisone 7.5  mg p.o. daily.   18.Synthroid 125 mcg p.o. daily.   19.Labetalol 200 mg p.o. b.i.d.   20.Lasix 20 mg p.o. every other day p.r.n. leg swelling.   21.Leflunomide 20 mg daily.   22.Lipitor 20 mg p.o. daily at bedtime.   23.Potassium chloride 20 mEq 1 tablet by mouth every other day, take       when you take Lasix.   24.Prednisone 7.5 mg p.o. daily.   25.Synthroid at 25 mcg p.o. daily.   26.Systane eyedrops over-the-counter 1 drop in both eyes daily as       needed.   27.Vitamin D2 50,000 units 1 tablet by mouth every Sunday.  DISPOSITION:  The patient is discharged home as per the wishes of the   patient's daughter, [name], who is the healthcare power of   attorney.      CONSULTATIONS:  Dr. Sherryl Manges,  Dr. Stan Head.      PROCEDURES PERFORMED:  Upper endoscopy which showed a Mallory-Weiss tear   and lower colonoscopy which showed a sessile polyp.   1. The patient's procedure was colonoscopy, polypectomy and submucosal       injection. IMPRESSION:  A 25 mm sessile polyp in the ascending       colon removed at the submucosal saline injection site marked with       SPOT.  Severe diverticulosis in the sigmoid colon, internal       hemorrhoids and otherwise exam not specified.  The pathology came       back with tubular adenoma for this polyp.   2. The patient also had upper endoscopy which showed ulcer in the       distal esophagus, probably mal Mallory-Weiss tear.  As she told me       she should did not vomit with a colonoscopy prep, she did vomit       with a colonoscopy prep.  There are adherent clots with no current       bleeding and with motion the esophagus, had some difficulty       desaturating her.  I did not attempt to therapy,  most of these       healed spontaneously.  There is submucosal nodule bulge in the       proximal stomach.      DIET:  The patient is on 2 gram sodium diet.      DISPOSITION:  The patient should follow up with Dr. Margaretmary Bayley  within   1 week.  The patient should also follow up with Dr. Margaretmary Bayley for a   PT/INR tomorrow.  I talked to Dr4. Stan Head.  He said he will send   communication to the patient.  The patient does not necessarily have an   active followup with Dr. Leone Payor.  I have given the patient Dr. Izola Price phone number, if the patient has any questions or any need to   discuss anything with Dr. Leone Payor.      CHIEF COMPLAINT:  Passed out.      HISTORY OF PRESENT ILLNESS:  [name] is an 77 year old female with   history of severe aortic stenosis, rheumatoid arthritis, polypharmacy   and hypothyroidism who was brought to the emergency room by her   caregiver after she passed out at home.  According to the caregiver,   the patient has not been herself with a last couple days.  She was taken   to her primary care physician where she was found to probably have   bronchitis.  She was started on an unspecified antibiotic.  She was just   not eating and drinking as normal.  Today when the patient attempted to   get up she completely passed out and fell down.  She did not get any   significant trauma as a caregiver caught her before the falling too   hard.  [name] is now alert, looking around, denying any pain.  As   mentioned she has absolutely no recollection of these events and she is   unable to contribute to the history at all.  Also the patient's course  was complicated by guaiac positive stools and as a result, the patient   had upper EGD and colonoscopy with findings discussed above.  The   patient did not drop her hemoglobin to best of my knowledge      HOSPITAL COURSE:  PROBLEM LIST:   1. Recurrent syncope.  The patient was seen by Dr. Duke Salvia.       His impression was probably vasomotor syncope.  He recommended down       titrate the hypertensive med, hydrate gently and check       orthostatics.  The patient is not a candidate for aortic valve       replacement.   If the syncope recurs, implant a loop recorder.   2. Iron-deficiency anemia was secondary most likely gastrointestinal       bleed.  The patient is sent home on iron.   3. Colonic polyp.  The patient had a tubular adenoma and patient with       those constraints, does not need a follow-up with Dr. Stan Head.       I have given Dr. Marvell Fuller number for any questions.   4. Hypertension.  The patient's blood pressure was controlled.  The       patient's antihypertensives were titrated to control blood       pressure.   5. Severe aortic stenosis.  The patient has severe aortic stenosis.       She is not a candidate for aortic valve replacement.  The patient       should not get a beta-blocker, ACE inhibitor because of severe       aortic stenosis.   6. Bilateral pulmonary emboli.  The patient is on Lovenox recurrently.       Coumadin has been restarted the patient will be sent home with home       health.  They will administer the patient's Lovenox for 5 days.       The patient will continue Coumadin.  The patient will follow up       with Dr. Margaretmary Bayley.  The patient will have PT/INR drawn on       August 07, 2009.   7. Rheumatoid arthritis.  The patient is continued on prednisone the       patient's leflunomide  has been restarted.   8. Diastolic congestive heart failure.  The patient has a history of       diastolic CHF.  Again the patient is euvolemic right now.  The       patient not been a beta-blocker or Ace inhibitor as a result of the       patient's severe aortic stenosis.   9. Altered mental status.  The patient has altered mental status which       we are monitoring.  This most likely secondary to worsened dementia       and multiple medical problems.  I doubt that the patient was much       improvement in her mental status   10.Deep venous thrombosis prophylaxis.  The patient is any on       therapeutic treatment of pulmonary embolus dose of Lovenox.        Penny Nephew,  MD         NH/MEDQ  D:  08/06/2009  T:  08/06/2009  Job:  [id number]      cc:   Margaretmary Bayley, M.D.     Electronically Signed  by Edward Plainfield HAMID  on 08/17/2009 01:21:51 PM

## 2010-06-22 NOTE — Progress Notes (Signed)
Summary: PT/INR  Phone Note From Other Clinic Call back at [phone]   Caller: [facility] Summary of Call: nurse in home now, does she need to draw PT/INR, was drawn yesterday @ dc Initial call taken by: Migdalia Dk,  September 01, 2009 1:32 PM  Follow-up for Phone Call        Spoke with [place] with [facility].  Explained to her we cannot follow pt's INRs because she is not seen by any of our physicians.  Called Dr. Laurena Slimmer office.  Spoke with Ms. Chestine Spore.  Explained pt needs someone to follow PT/INRs.  The pt has an appt in their office in 1 week.  She agreed to give orders to home health.  I faxed copies of the patient's most recent H & P and Discharge summary to Dr. Lindell Spar office for review.   Follow-up by: Weston Brass PharmD,  September 01, 2009 2:26 PM  Additional Follow-up for Phone Call Additional follow up Details #1::        Spoke with [facility] with [facility].  She is aware to call Dr. Laurena Slimmer office for future orders.  Additional Follow-up by: Weston Brass PharmD,  September 01, 2009 2:29 PM

## 2010-06-22 NOTE — Progress Notes (Signed)
  Phone Note Call from Patient Call back at [phone]   Caller: Daughter-Sylvia Summary of Call: Pt's daughter presented at office concerned about her mother.  [name] states that her mother went into [facility] on Sunday with a GI Bleed under hospitalist care and they are not consulting with our office or [facility].  [name] is very concerned about the patient's anticoagulants.  She states that all anticoags have been stopped and there is a known clot in her left thigh.  [name] a tearful that the doctors are going to let her mother "lay there and die".  Pt had a very bloody BM on Sunday and as of yesterday (3/29) pt had "flakes" of blood in her stool.  [name] does not know if pt has had any rectal bleeding today.   I advised [name] that I would check to see if our doctors had been to see her mom and I encouraged her to contact [facility] as she is also a pt there and they would be able to determine if a consult was needed.  [name] is agreeable. Initial call taken by: Francee Piccolo CMA Duncan Dull),  August 19, 2009 3:26 PM  Follow-up for Phone Call        I spoke to Dr. Arlyce Dice, he states that we signed off on Mrs. Newkirk on 3/29 as she had stopped bleeding.  She was not rescoped for this same reason.   Francee Piccolo CMA Duncan Dull)  August 19, 2009 3:35 PM

## 2010-06-22 NOTE — Miscellaneous (Signed)
[facility]   Imported By: Lenard Forth 09/18/2009 12:07:59  _____________________________________________________________________  External Attachment:    Type:   Image     Comment:   External Document

## 2010-06-22 NOTE — Procedures (Signed)
Summary: Colonoscopy  Patient: [name] Note: All result statuses are Final unless otherwise noted.  Tests: (1) Colonoscopy (COL)   COL Colonoscopy           DONE     [facility]     [address]     [place], [place]  [address]           COLONOSCOPY PROCEDURE REPORT           PATIENT:  [name], [name]  MR#:  [id number]     BIRTHDATE:  27-Dec-1917, 89 yrs. old  GENDER:  female           ENDOSCOPIST:  Iva Boop, MD, [facility]     Referred by:  [facility]           PROCEDURE DATE:  08/04/2009     PROCEDURE:  Colonoscopy with polypectomy and submucosal injection     ASA CLASS:  Class III     INDICATIONS:  FOBT positive stool, iron deficiency anemia waiting     to start warfarin for new bilat PE's           MEDICATIONS:   Fentanyl 100 mcg IV, Versed 9 mg IV           DESCRIPTION OF PROCEDURE:   After the risks benefits and     alternatives of the procedure were thoroughly explained, informed     consent was obtained.  Digital rectal exam was performed and     revealed no abnormalities.   The [id number] ([id number]) endoscope was     introduced through the anus and advanced to the cecum, which was     identified by both the appendix and ileocecal valve, limited by     extreme patient discomfort, a tortuous colon, patient cooperation.     The quality of the prep was adequate, using Colyte.  The     instrument was then slowly withdrawn as the colon was fully     examined.     Insertion 12:00 minutes and withdrawal: 32 minutes.     <<PROCEDUREIMAGES>>           FINDINGS:  A sessile polyp was found in the ascending colon. It     was 25 mm in size. submucosal injection Polyp was snared, then     cauterized with monopolar cautery. Retrieval was successful. snare     polyp Normal saline injected x 2.5 cc with lift. Piecemeal     polypectomy.     SPOT tattoos also proximal and distal.  Severe diverticulosis was     found in the sigmoid colon. Somewhat  fixed, creating discomfort     with passage and difficulty with insertion.  This was otherwise a     normal examination of the colon.   Retroflexed views in the rectum     revealed internal hemorrhoids.    The scope was then withdrawn     from the patient and the procedure completed.           COMPLICATIONS:  None           ENDOSCOPIC IMPRESSION:     1) 25 mm sessile polyp in the ascending colon - removed after     submucosal saline injection - site marked with SPOT     2) Severe diverticulosis in the sigmoid colon     3) Internal hemorrhoids     4) Otherwise normal examination  RECOMMENDATIONS:     Do not start warfarin yet. See EGD report also.     Resume Lovenox for time being.           REPEAT EXAM:  as needed - will depend upon pathology  and EGD     results and overall scenario as far as close follow-up of     piecemeal polypectomy of large polyp           Iva Boop, MD, [facility]           CC:  Margaretmary Bayley, MD     The Patient           n.     eSIGNED:   Iva Boop at 08/04/2009 03:20 PM           Irene Shipper, [id number]  Note: An exclamation mark (!) indicates a result that was not dispersed into the flowsheet. Document Creation Date: 08/04/2009 3:39 PM _______________________________________________________________________  (1) Order result status: Final Collection or observation date-time: 08/04/2009 14:39 Requested date-time:  Receipt date-time:  Reported date-time:  Referring Physician:   Ordering Physician: Stan Head [phone]) Specimen Source:  Source: Launa Grill Order Number: [id number] Lab site:

## 2010-06-22 NOTE — Assessment & Plan Note (Signed)
Summary: 3 month rov/sl   Visit Type:  Follow-up Primary Provider:  Margaretmary Bayley, MD  CC:  sob and leg pain.  History of Present Illness: [name] is seen following a hospitalization in March for syncope occurring in the context of severe inoperable aortic stenosis with an aortic valve area of 0.6 and hypertension. It was my impression that she had vasomotor/orthostatic syncope. Her symptoms are somewhat ameliorated by reduction in blood pressure. When seen post hosp she wsas still having dizziness and we reduced her imdur.  she has had no recurrent syncope    She also has severe dementia.  Current Medications (verified): 1)  Accolate 20 Mg Tabs (Zafirlukast) .Marland Kitchen.. 1 By Mouth Two Times A Day 2)  Acetaminophen 500 Mg Caps (Acetaminophen) .... 2 Tabs By Mouth Two Times A Day 3)  Aricept 10 Mg Tabs (Donepezil Hcl) .Marland Kitchen.. 1 By Mouth Daily At Bedtime 4)  Lomotil 2.5-0.025 Mg Tabs (Diphenoxylate-Atropine) .Marland Kitchen.. 1 - 2 Tabs As Needed For Upset Stomach. 5)  Ferrous Sulfate 325 (65 Fe) Mg Tabs (Ferrous Sulfate) .Marland Kitchen.. 1 By Mouth Daily 6)  Labetalol Hcl 200 Mg Tabs (Labetalol Hcl) .... Take 1/2  Tablet By Mouth Twice A Day 7)  Furosemide 20 Mg Tabs (Furosemide) .... Once Daily 8)  Leflunomide 20 Mg Tabs (Leflunomide) .Marland Kitchen.. 1 By Mouth Daily. 9)  Lipitor 20 Mg Tabs (Atorvastatin Calcium) .... Take One Tablet By Mouth Daily At Bedtime. 10)  Potassium Chloride Crys Cr 20 Meq Cr-Tabs (Potassium Chloride Crys Cr) .... Take One Tablet By Mouth Every Other Day. 11)  Prednisone 5 Mg Tabs (Prednisone) .... 1.5 Tabs Daily. 12)  Synthroid 125 Mcg Tabs (Levothyroxine Sodium) .Marland Kitchen.. 1 By Mouth Daily. 13)  Systane 0.4-0.3 % Soln (Polyethyl Glycol-Propyl Glycol) .Marland Kitchen.. 1 Drop in Each Eye Daily As Needed. 14)  Nexium 40 Mg Cpdr (Esomeprazole Magnesium) .... Once Daily 15)  Vitamin D2 50000iu .... Weekly 16)  Vitamin C Cr 500 Mg Cr-Caps (Ascorbic Acid) .... Take One Capsule Once Daily 17)  Diphenoxylate-Atropine  2.5-0.025 Mg Tabs (Diphenoxylate-Atropine) .... Uad As Needed For Loose Stools 18)  Aspir-Low 81 Mg Tbec (Aspirin) .... Once Daily 19)  Centrum Silver  Tabs (Multiple Vitamins-Minerals) .... Once Daily  Allergies (verified): No Known Drug Allergies  Past History:  Past Medical History: Last updated: 09/21/2009 CHRONIC DIASTOLIC HEART FAILURE (ICD-428.32) RHEUMATOID ARTHRITIS (ICD-714.0) ESOPHAGEAL ULCER, WITH BLEEDING (ICD-530.21) 07/2009 Pulmonary Embolism AORTIC STENOSIS - Severe ESSENTIAL HYPERTENSION, BENIGN (ICD-401.1) TUBULOVILLOUS ADENOMA, COLON (ICD-211.3) IRON DEFICIENCY ANEMIA SECONDARY TO BLOOD LOSS (ICD-280.0) SYNCOPE (ICD-780.2) Musculoskeletal chest pain Dementia Hypothyroidism Hyperlipidemia GI bleeds on Coumadin  Vital Signs:  Patient profile:   77 year old female Height:      63 inches Weight:      185 pounds Pulse rate:   78 / minute BP sitting:   140 / 80  (left arm) Cuff size:   large  Vitals Entered By: Burnett Kanaris, CNA (March 10, 2010 11:28 AM)  Physical Exam  General:  The patient was alert and oriented in no acute distress. HEENT Normal.  Neck veins were flat, carotids were brisk.  Lungs were clear.  Heart sounds were regular with harsh 3/6 systolic murmur with a single S2 Abdomen was soft with active bowel sounds. There is no clubbing cyanosis ; left foot is in a boot Skin Warm and dry    Impression & Recommendations:  Problem # 1:  ORTHOSTATIC DIZZINESS (ICD-780.4) persisting but improved  Problem # 2:  CHRONIC DIASTOLIC HEART FAILURE (  ICD-428.32) stable on current meds The following medications were removed from the medication list:    Warfarin Sodium 3 Mg Tabs (Warfarin sodium) .Marland Kitchen... Take 1 tablet on tues, thurs, sat and sun.  take 2 tablets all other days    Isosorbide Mononitrate Cr 30 Mg Xr24h-tab (Isosorbide mononitrate) .Marland Kitchen... Take one tablet by mouth daily Her updated medication list for this problem includes:     Labetalol Hcl 200 Mg Tabs (Labetalol hcl) .Marland Kitchen... Take 1/2  tablet by mouth twice a day    Furosemide 20 Mg Tabs (Furosemide) ..... Once daily    Aspir-low 81 Mg Tbec (Aspirin) ..... Once daily  Problem # 3:  AORTIC STENOSIS (ICD-424.1) severe-nonoperable The following medications were removed from the medication list:    Isosorbide Mononitrate Cr 30 Mg Xr24h-tab (Isosorbide mononitrate) .Marland Kitchen... Take one tablet by mouth daily Her updated medication list for this problem includes:    Labetalol Hcl 200 Mg Tabs (Labetalol hcl) .Marland Kitchen... Take 1/2  tablet by mouth twice a day    Furosemide 20 Mg Tabs (Furosemide) ..... Once daily

## 2010-06-22 NOTE — Medication Information (Signed)
Summary: Coumadin Clinic  Anticoagulant Therapy  Managed by: Inactive Referring MD: Dr Graciela Husbands PCP: Dr Margaretmary Bayley Supervising MD: Eden Emms MD, Theron Arista Indication 1: Pulmonary Embolism Lab Used: [facility] [place] Site: [address] INR RANGE 2 - 3          Comments: Pt followed by Dr. Margaretmary Bayley   Allergies: No Known Drug Allergies  Anticoagulation Management History:      Positive risk factors for bleeding include an age of 75 years or older.  The bleeding index is 'intermediate risk'.  Positive CHADS2 values include History of CHF, History of HTN, and Age > 19 years old.  Anticoagulation responsible provider: Eden Emms MD, Theron Arista.    Anticoagulation Management Assessment/Plan:      The patient's current anticoagulation dose is Warfarin sodium 5 mg tabs: Use as directed by Anticoagulation Clinic.  The target INR is 2.0-3.0.  The next INR is due 08/17/2009.  Anticoagulation instructions were given to caregiver in home.  Results were reviewed/authorized by Inactive.         Prior Anticoagulation Instructions: INR 2.9  Called spoke with pt's caregiver, advised to decr pt's dosage to 1 tablet daily except 1/2 tablet on Wednesdays.  Will recheck on 08/17/09. Called spoke with Noreene Larsson, [facility] advised to recheck on 08/17/09.

## 2010-06-22 NOTE — Assessment & Plan Note (Signed)
Summary: Dysphagia and diarrhea   History of Present Illness Visit Type: Initial Visit Primary GI MD: Stan Head MD [facility] Primary Provider: Margaretmary Bayley, MD Chief Complaint: post hospital;  dysphagia & nausea History of Present Illness:   77 yo woman I know from EGD (esophageal ulcer/Mallory Janann August) and colonoscopy (diverticulosis and 2.5 cm tubulovillous adenoma). She was rehospitalized after that inpatient evaluation for GI bleeding and improved with holding anticoagulation which is tajken for PE. An IVC filter has been placed.. Pulmonary thought that she shoud still take Coumadin for 3-6 months since dx of pulmonary embolism.She also had an April admit for chest pain thought to be musculoskeletal. Now here for follow-up with complaints of dysphagia and diarrhea. Daughter says she chews up her food and spits it out. Meat is the main problem but daughter says liquids also an issue. Her daughter says that liquids are a problem  with immediate belching attempts after she ingests liquids. This has been noticed in the last 2 months.  Chest still hurts some also. Not related to swallowing.  Not clear about weight loss. Appetite is ok.  Aricept x years, it was held for a while because Dr. Chestine Spore thought memory was ok but went back on it Summer 2010 and daughter says it helps memory. There is also diarrhea,  over the past year, with early AM bowel movements and are loose associated with some incontinence. up to 3x n 24 hours. Last 3 mornings she has had nausea and heaving without emesis, after her shower. Has had in the past also. Also c/o feeling hot.   GI Review of Systems    Reports acid reflux, bloating, chest pain, dysphagia with solids, and  nausea.      Denies abdominal pain, belching, dysphagia with liquids, heartburn, loss of appetite, vomiting, vomiting blood, weight loss, and  weight gain.      Reports diarrhea, diverticulosis, and  hemorrhoids.     Denies anal fissure, black tarry  stools, change in bowel habit, constipation, fecal incontinence, heme positive stool, irritable bowel syndrome, jaundice, light color stool, liver problems, rectal bleeding, and  rectal pain.    Current Medications (verified): 1)  Amlodipine Besylate 5 Mg Tabs (Amlodipine Besylate) .... Take One Tablet By Mouth Two Times A Day 2)  Warfarin Sodium 5 Mg Tabs (Warfarin Sodium) .... Use As Directed By Anticoagulation Clinic Hold 3)  Accolate 20 Mg Tabs (Zafirlukast) .Marland Kitchen.. 1 By Mouth Two Times A Day 4)  Acetaminophen 500 Mg Caps (Acetaminophen) .... 2 Tabs By Mouth Two Times A Day 5)  Aricept 10 Mg Tabs (Donepezil Hcl) .Marland Kitchen.. 1 By Mouth Daily At Bedtime 6)  Lomotil 2.5-0.025 Mg Tabs (Diphenoxylate-Atropine) .Marland Kitchen.. 1 - 2 Tabs As Needed For Upset Stomach. 7)  Ferrous Sulfate 325 (65 Fe) Mg Tabs (Ferrous Sulfate) .Marland Kitchen.. 1 By Mouth Daily 8)  Isosorbide Mononitrate Cr 30 Mg Xr24h-Tab (Isosorbide Mononitrate) .... Take One Tablet By Mouth Daily 9)  Labetalol Hcl 200 Mg Tabs (Labetalol Hcl) .... Take One Tablet By Mouth Twice A Day 10)  Furosemide 20 Mg Tabs (Furosemide) .... Take One Tablet By Mouth Every Other Day. 11)  Leflunomide 20 Mg Tabs (Leflunomide) .Marland Kitchen.. 1 By Mouth Daily. 12)  Lipitor 20 Mg Tabs (Atorvastatin Calcium) .... Take One Tablet By Mouth Daily At Bedtime. 13)  Potassium Chloride Crys Cr 20 Meq Cr-Tabs (Potassium Chloride Crys Cr) .... Take One Tablet By Mouth Every Other Day. 14)  Prednisone 5 Mg Tabs (Prednisone) .... 1.5 Tabs Daily. 15)  Synthroid 125 Mcg Tabs (Levothyroxine Sodium) .Marland Kitchen.. 1 By Mouth Daily. 16)  Systane 0.4-0.3 % Soln (Polyethyl Glycol-Propyl Glycol) .Marland Kitchen.. 1 Drop in Each Eye Daily As Needed. 17)  Nexium 40 Mg Cpdr (Esomeprazole Magnesium) .... Once Daily 18)  Vitamin D2 50000iu .... Weekly  Allergies (verified): No Known Drug Allergies  Past History:  Past Medical History: CHRONIC DIASTOLIC HEART FAILURE (ICD-428.32) RHEUMATOID ARTHRITIS (ICD-714.0) ESOPHAGEAL ULCER,  WITH BLEEDING (ICD-530.21) 07/2009 Pulmonary Embolism AORTIC STENOSIS - Severe ESSENTIAL HYPERTENSION, BENIGN (ICD-401.1) TUBULOVILLOUS ADENOMA, COLON (ICD-211.3) IRON DEFICIENCY ANEMIA SECONDARY TO BLOOD LOSS (ICD-280.0) SYNCOPE (ICD-780.2) Musculoskeletal chest pain Dementia Hypothyroidism Hyperlipidemia GI bleeds on Coumadin  Past Surgical History: IVC filter 07/2009 Knee Arthroscopy Cataract Extraction  Family History: Family History of Breast Cancer:Daughter Family History of Esophageal Cancer:Daughter Family History of Prostate Cancer:Nephew Family History of Diabetes: daughter Family History of Heart Disease:   Social History: Reviewed history from 09/18/2009 and no changes required. The patient denies any smoking or any drug use.  She   currently lives at home with the caregiver and usually ambulates with a   walker.   Review of Systems       The patient complains of arthritis/joint pain, back pain, confusion, heart murmur, shortness of breath, and swelling of feet/legs.    Vital Signs:  Patient profile:   77 year old female Height:      63 inches Weight:      177 pounds BMI:     31.47 Pulse rate:   76 / minute Pulse rhythm:   regular BP sitting:   100 / 56  (left arm) Cuff size:   regular  Vitals Entered By: June McMurray CMA Duncan Dull) (Sep 21, 2009 10:38 AM)  Physical Exam  General:  elderly, NAD Eyes:  anicteric Lungs:  Clear throughout to auscultation. Heart:  harsh systolic ejection murmur consistent with aortic stenosis Abdomen:  obese, soft and non-tender BS+ Neurologic:  alert, oriented  to 2011, place, ? person     Impression & Recommendations:  Problem # 1:  DYSPHAGIA (ICD-787.29) Assessment Unchanged Thuis was described to me in March 2011 (durinhg hospitalization). Sounds behavioral vs. oropharyngeal to me. Start with modified barium swallow. The EGd did not show a stricture. a PEG could be indicated and was discussed some  today...await these studies and overall plan of care (see dementia)  Orders: Barium Swallow, Modified  (Modified BS)  Problem # 2:  DIARRHEA (ICD-787.91) Assessment: Unchanged Chronic issue, ? meds, ? IBS. No colitis on colonoscopy. use Lomitil at bedtime and up to two times a day regularly, if atropine component is an issue for cardiology would use loperamide instead...will see what Dr. Graciela Husbands thinks about this.  Problem # 3:  DEMENTIA (ICD-294.8) Assessment: Unchanged major issue as far as care is concerned daughter has an aid up to 29 hrs/week but is experiencing caregiver fatigue to say the least mom does not want nursing home advised she discuss with Dr. Chestine Spore as it seems like she will need skilled catre and likely in a facility as daughter cannot seem to keep up with the needs of her mom  Orders: TLB-TSH (Thyroid Stimulating Hormone) (84443-TSH) TLB-B12, Serum-Total ONLY ([address]-U04)  Problem # 4:  AORTIC STENOSIS (ICD-424.1) Assessment: Comment Only ? if spells after shower are related to this and vasovagal issues to discus with Dr. Graciela Husbands today  Problem # 5:  PE (ICD-415.19)  Problem # 6:  IRON DEFICIENCY ANEMIA SECONDARY TO BLOOD LOSS (ICD-280.0) not bleeding and no need for  further evaluation    EGD  Procedure date:  08/04/2009  Findings:          1) Ulcer in the distal esophagus - probably a Mallory-weiss tear     as she told me she did vomit with the colonoscopy prep. There are     adherent clots but no current bleeding and with motion in the     esophagus and some difficulty desating her I did not attempt     therapy (most of these heal spontaneously).     2) Submucosal nodule/bulge in proximal stomach - (nothing seen     in this area on Jan 2011 CT abd)     3) Otherwise normal examination  Colonoscopy  Procedure date:  08/04/2009  Findings:      1) 25 mm sessile polyp in the ascending colon - removed after     submucosal saline injection - site marked  with SPOT TUBULOVILLOUS ADENOMA     2) Severe diverticulosis in the sigmoid colon     3) Internal hemorrhoids     4) Otherwise normal examination  Comments:      No routine repat exam due to age, comorbidities and findings   Patient Instructions: 1)  Copy sent to : Dr. Margaretmary Bayley and Dr. Sherryl Manges 2)  Your Modified Barium Swallow is scheduled for 10/06/09. 3)  Please go to the basement to have your lab tests drawn today.  4)  Take Lomotil nightly as directed by Dr. Leone Payor.  You may need to take Imodium.  We will follow up with Dr. Graciela Husbands. 5)  The medication list was reviewed and reconciled.  All changed / newly prescribed medications were explained.  A complete medication list was provided to the patient / caregiver.

## 2010-06-22 NOTE — Discharge Summary (Signed)
Summary: Chest Pain    NAME:  [name], [name]              ACCOUNT NO.:  [id number]      MEDICAL RECORD NO.:  [id number]          PATIENT TYPE:  INP      LOCATION:  3707                         FACILITY:  [facility]      PHYSICIAN:  Clydia Llano, MD       DATE OF BIRTH:  1917-11-23      DATE OF ADMISSION:  09/08/2009   DATE OF DISCHARGE:  09/09/2009                                  DISCHARGE SUMMARY      PRIMARY CARE PHYSICIAN:  Dr. Margaretmary Bayley      REASON FOR ADMISSION:  Chest pain.      DISCHARGE DIAGNOSES:   1. Chest pain secondary to musculoskeletal pain, noncardiac.   2. Severe aortic stenosis.   3. Recent pulmonary embolism, patient on Coumadin.   4. Hypertension.   5. History of peptic ulcer disease.   6. Rheumatoid arthritis.   7. Hypothyroidism   8. Dementia.   9. Chronic diastolic congestive heart failure.   10.Hyperlipidemia.      DISCHARGE MEDICATIONS:   1. Accolate 20 mg p.o. b.i.d.   2. Acetaminophen 500 mg 2 tablets p.o. b.i.d.   3. Amlodipine 5 mg p.o. b.i.d.   4. Aricept 10 mg p.o. at bedtime.   5. Complex vitamin C time release over-the-counter 1 tablet p.o.       b.i.d.   6. Diphenoxylate/atropine  2.5/0.025 one or two tablets p.o. daily for       loose stools.   7. Ferrous sulfate 325 mg over-the-counter p.o. daily.   8. Isosorbide mononitrate 30 mg p.o. daily.   9. Labetalol 220 mg p.o. b.i.d.   10.Leflunomide 20 mg p.o. daily.   11.Lipitor 20 mg p.o. at bedtime.   12.Pantoprazole 40 mg p.o. b.i.d. before meals.   13.Potassium chloride 20 mEq p.o. every other day.   14.Prednisone 5 mg 1-/1/2 tablet, total 7.5 mg p.o. daily.   15.Synthroid 125 mcg p.o. daily.   16.Systane eye drop over-the-counter 1 drop both eyes b.i.d.   17.Vitamin D2 50,000 units p.o. weekly.   18.Warfarin 6 mg p.o. daily.      BRIEF HISTORY/EXAMINATION:  [name] is an 77 year old female who was   recently discharged from the hospital after she had been treated for GI   bleed  and while she was on chronic anticoagulation.  The patient comes   back with chief complaint of chest pain.  The patient's daughter   provides the most of the history of the time of admission. The patient   has been complain of chest pain for the last 2 days.  She has been   having some nausea.  She denies any shortness of breath.  Denies any   diarrhea, constipation.  As per the daughter patient has intermittent   chest pain and so she was concerned so she brought the patient to the   hospital.  The patient this time denies any chest pain.  She is sitting   in bed comfortable and she admits to having some left shoulder pain.  Note that the patient has history of recent pulmonary embolism status   post IVC filter pay.  The patient came in with therapeutic Coumadin.      RADIOLOGY:   1. Left shoulder x-ray:  No fracture or dislocation was evident with       chronic osteopenic appearance.   2. Chest x-ray showed essentially clear with some vascular congestion.      HOSPITAL STAY.:   1. Chest pain.  The patient mentioned she was having chest pain on and       off for the past 2 days.  The patient was  admitted to the hospital       to rule out.  Three sets of cardiac enzymes were negative for anycardiac involvement.  The patient EKG remained without  significant       changes.  Her medications were continued during the       hospitalization.  The patient has some tender points on palpation       in the mid sternum, which might be consistent with her arthritis.       The patient will follow up with primary care physician.  The       patient will be discharged on her arthritis medications and Tylenol       p.r.n. for the chest wall pain/tenderness.   2. History of PE.  The patient currently on  Coumadin.  The patient       had therapeutic INR while she has been in the hospital.  The       patient did not mention any shortness of breath or any pleuritic       chest pain.  The patient also  as mentioned above has IVC filter in       place.   3. Coumadin therapy.  Coumadin therapy for pulmonary embolism,       [facility] following patient at home.  Target INR is 2 to       3.  Today, it is 2.5 on day of discharge.   4. History of recent gastrointestinal bleed.  The patient denies any       symptoms of GI bleed as well as no evidence of bleeding.  The       patient will not be discharged home on aspirin. She is on PPI twice       a day.   5. Hypertension.  The patient will be continued on home medications       include amlodipine, labetalol and isosorbide mononitrate.   6. History of chronic diastolic heart failure.  Chest x-ray showed       some congestion with no lung findings.  The patient will be       continued on her Lasix 20 mg.  The patient did not show did not       have any lower extremity edema, did not have any shortness of       breath or orthopnea.  Her BNP is minimally elevated at 123.   7. Dementia.  The patient continued on the Aricept.   8. Severe aortic stenosis.  Echo on May 08, 2009, showed severe       aortic stenosis with moderately calcified valves with mild       regurgitation.  The valve area is 0.99 cm2, mean gradient is 46       with peak gradient 78 mmHg.      DIET:  Heart-healthy diet.  DISPOSITION:  Home.      ACTIVITY:  As tolerated.      FOLLOW-UP:  With Dr. Chestine Spore in 1-2 weeks.               Clydia Llano, MD         ME/MEDQ  D:  09/09/2009  T:  09/09/2009  Job:  [id number]      cc:   Margaretmary Bayley, M.D.   Duke Salvia, MD, [facility]      Electronically Signed by Clydia Llano  on 09/10/2009 10:21:35 PM

## 2010-06-22 NOTE — Letter (Signed)
Summary: Patient Notice- Polyp Results  [facility]  [address] [place], [place] [address]   Phone: [phone]  Fax: [phone]        August 11, 2009 MRN: [id number]    [facility] [address] [place], [place]  [address]    Dear Ms. Cedrone,  I am pleased to inform you that the colon polyp(s) removed during your recent colonoscopy was (were) found to be benign (no cancer detected) upon pathologic examination.  The esophageal ulcer was also benign and should heal.  Should you develop new or worsening symptoms of abdominal pain, bowel habit changes or bleeding from the rectum or bowels, please schedule an evaluation with either your primary care physician or with me.  No further action with gastroenterology is needed at this time. Please      follow-up with your primary care physician for your other healthcare      needs.  Please call us if you are having persistent problems or have questions about your condition that have not been fully answered at this time.  Sincerely,  Iva Boop MD, [facility]  This letter has been electronically signed by your physician.  Appended Document: Patient Notice- Polyp Results letter mailed to patient's home

## 2010-06-22 NOTE — Consult Note (Signed)
Summary: GI Consult: Dr. Elnoria Howard  NAME:  [name], [name]              ACCOUNT NO.:  [id number]      MEDICAL RECORD NO.:  [id number]          PATIENT TYPE:  INP      LOCATION:  3316                         FACILITY:  [facility]      PHYSICIAN:  Jordan Hawks. Elnoria Howard, MD    DATE OF BIRTH:  [birth year]      DATE OF CONSULTATION:  08/16/2009   DATE OF DISCHARGE:                                    CONSULTATION      REASON FOR CONSULTATION:  Lower GI bleed.      HISTORY OF PRESENT ILLNESS:  This is an 77 year old female with a past   medical history of ascending colon, tubular adenoma, severe aortic   stenosis, recent pulmonary embolism requiring anticoagulation,   rheumatoid arthritis, coronary artery disease, diastolic CHF,   hyperlipidemia, hypertension, recurrent syncope, and recent Mallory-   Weiss tear.  She was admitted to the hospital with lower GI bleed.  The   patient was previously admitted 2 weeks ago for syncope.  At that time,   it was felt to be multifactorial.  She was also noted to have an anemia   and further workup was performed with an EGD and colonoscopy.  Dr.   Leone Tran performed the procedure and noted that she had a distal   esophageal ulcer measuring approximately 2 cm and also in the colon she   was found to have a 25-mm ascending colon polyp as well as severe   diverticulosis.  The procedures were performed without any complication   and she subsequently was able to be discharged home without any   problems.  She was started on Coumadin and there were no complaints of   any bleeding.  The Coumadin was initiated because of her pulmonary   embolism.  There was no bleeding until today who approximately 10:30   a.m. where she started to have some sensations of nausea and also   cramping in the abdomen and subsequently she had a large bout of   hematochezia and then at home she was promptly brought to the emergency   room where she had another bout that was witnessed by the emergency  room   staff per the records and an incisional bloody bowel movement.  The   patient remained hemodynamically stable and GI consultation was   requested for further evaluation and treatment.      PAST MEDICAL AND SURGICAL HISTORY:  As stated above.      FAMILY HISTORY:  Noncontributory.      SOCIAL HISTORY:  Negative for alcohol, tobacco, or illicit drug use.      MEDICATIONS:   1. Aricept 10 mg 1 p.o. nightly.   2. Levothyroxine 125 mcg p.o. daily.   3. Protonix 40 mg IV b.i.d.   4. Vitamin K 10 mg IV x1.   5. Prednisone 7.5 mg p.o. daily.   6. Simvastatin 40 mg p.o. daily.   7. Tylenol 650 mg p.o. q.4 h.   8. Zofran 4 mg IV q.6 h.   9. Dilaudid 0.5-1  mg IV q.4 h.   10.Phenergan 6.25 mg p.o. q.6 h. p.r.n.      ALLERGIES:  No known drug allergies.      PHYSICAL EXAMINATION:  VITAL SIGNS:  Stable.   GENERAL:  The patient is in no acute distress.  She is uncertain why she   is in the hospital.  She does have history of dementia.   HEENT:  Normocephalic and atraumatic.  Extraocular muscles are intact.   NECK:  Supple.  No lymphadenopathy.   LUNGS:  Clear to auscultation bilaterally.   CARDIOVASCULAR:  Regular rate and rhythm.   ABDOMEN:  Obese, soft, nontender, and nondistended.   EXTREMITIES:  No clubbing, cyanosis, or edema.   RECTAL:  Deferred as she is currently having IV place.      LABORATORY VALUES:  White blood cell count is 7.3, hemoglobin 11.9, and   platelets at 272.  INR 3.69.  Sodium is 138, potassium 4.0, chloride   111, glucose 110, BUN 20, and creatinine 1.0.      IMPRESSION:   1. Hematochezia.  It is a post-polypectomy bleed versus a diverticular       bleed, precipitated by an elevated INR.   2. Supratherapeutic INR.   3. Multiple other medical problems.      After evaluation of the patient, there is a high likelihood that she   does have bleeding from her post-polypectomy site which was precipitated   by the supratherapeutic INR.  Diverticular bleed is  also a possibility   that it is also precipitated by the supratherapeutic INR.  Upon further   review, Dr. Leone Tran did report that it was approximately 2.5 cm;   however, upon review of the pathology report, it was measured to be 0.3-   0.6 cm.  There is a discrepancy in regards to the size of the polyp,   however, it is a potential site of bleeding.      PLAN:  At this time is:   1. To follow the hemoglobin.   2. To correct INR.   3. If the bleeding persists, then a repeat colonoscopy will be       required to help isolate the site of bleeding.  I do not feel that       the bleeding site is from the upper GI source and she is in a       hemodynamically stable state at this time.               Jordan Hawks Elnoria Howard, MD            PDH/MEDQ  D:  08/16/2009  T:  08/17/2009  Job:  [id number]      cc:   Margaretmary Bayley, M.D.   Iva Boop, MD,FACG

## 2010-06-22 NOTE — Progress Notes (Signed)
Summary: Pending PT/INR result  Phone Note Call from Patient   Caller: family member Call For: [facility] Summary of Call: Pt's family member called concerned who pt is supposed to f/u with with reguards to her PE.  Advised per hospital d/c pt is to f/u with Dr Margaretmary Bayley pt's primary MD in 1 week and PT/INR with Dr Chestine Spore as well.  We took over f/u of coumadin over the weekend since pt has been consulted on by both Dr Graciela Husbands for syncopy and Dr Leone Payor.  Advised if syncopy persists Dr Graciela Husbands will be happy to see pt in office for further f/u however at this time syncopy was felt to be secondary to PE.  The most appropriate f/u for pt's coumadin would be to transition f/u to Dr Chestine Spore since he is following pt for PE and no further appts are needed with Dr Graciela Husbands at this time for syncopy.  But in the interim we are more than happy to help monitor and follow pt's coumadin until care is resumed by Dr Chestine Spore.  Family member given phone number to [facility] and advised to call with any further questions or problems.   Initial call taken by: Cloyde Reams RN,  August 10, 2009 4:15 PM  Follow-up for Phone Call        INR on 08/08/09 was 2.10/23.4 called to Erasmo Score, she instructed pt to d/c Lovenox and continue on coumadin 5mg  daily.  Recheck on 08/10/09.  Katie, [facility] called stated she was unable to draw blood today d/t dehydration.  Advised we really needed a result given recent dx PE and Lovenox d/c.  Requested another nurse retry, or take a coag machine to check PT/INR today if possible. Follow-up by: Cloyde Reams RN,  August 10, 2009 4:21 PM

## 2010-06-29 IMAGING — CR DG CHEST 1V PORT
1 series · 1 of 1 positions shown · non-contrast
Comparison: Chest radiograph and CTA of the chest performed
08/01/2009

CLINICAL DATA: Chest pain.

PORTABLE CHEST - 1 VIEW

[AP]
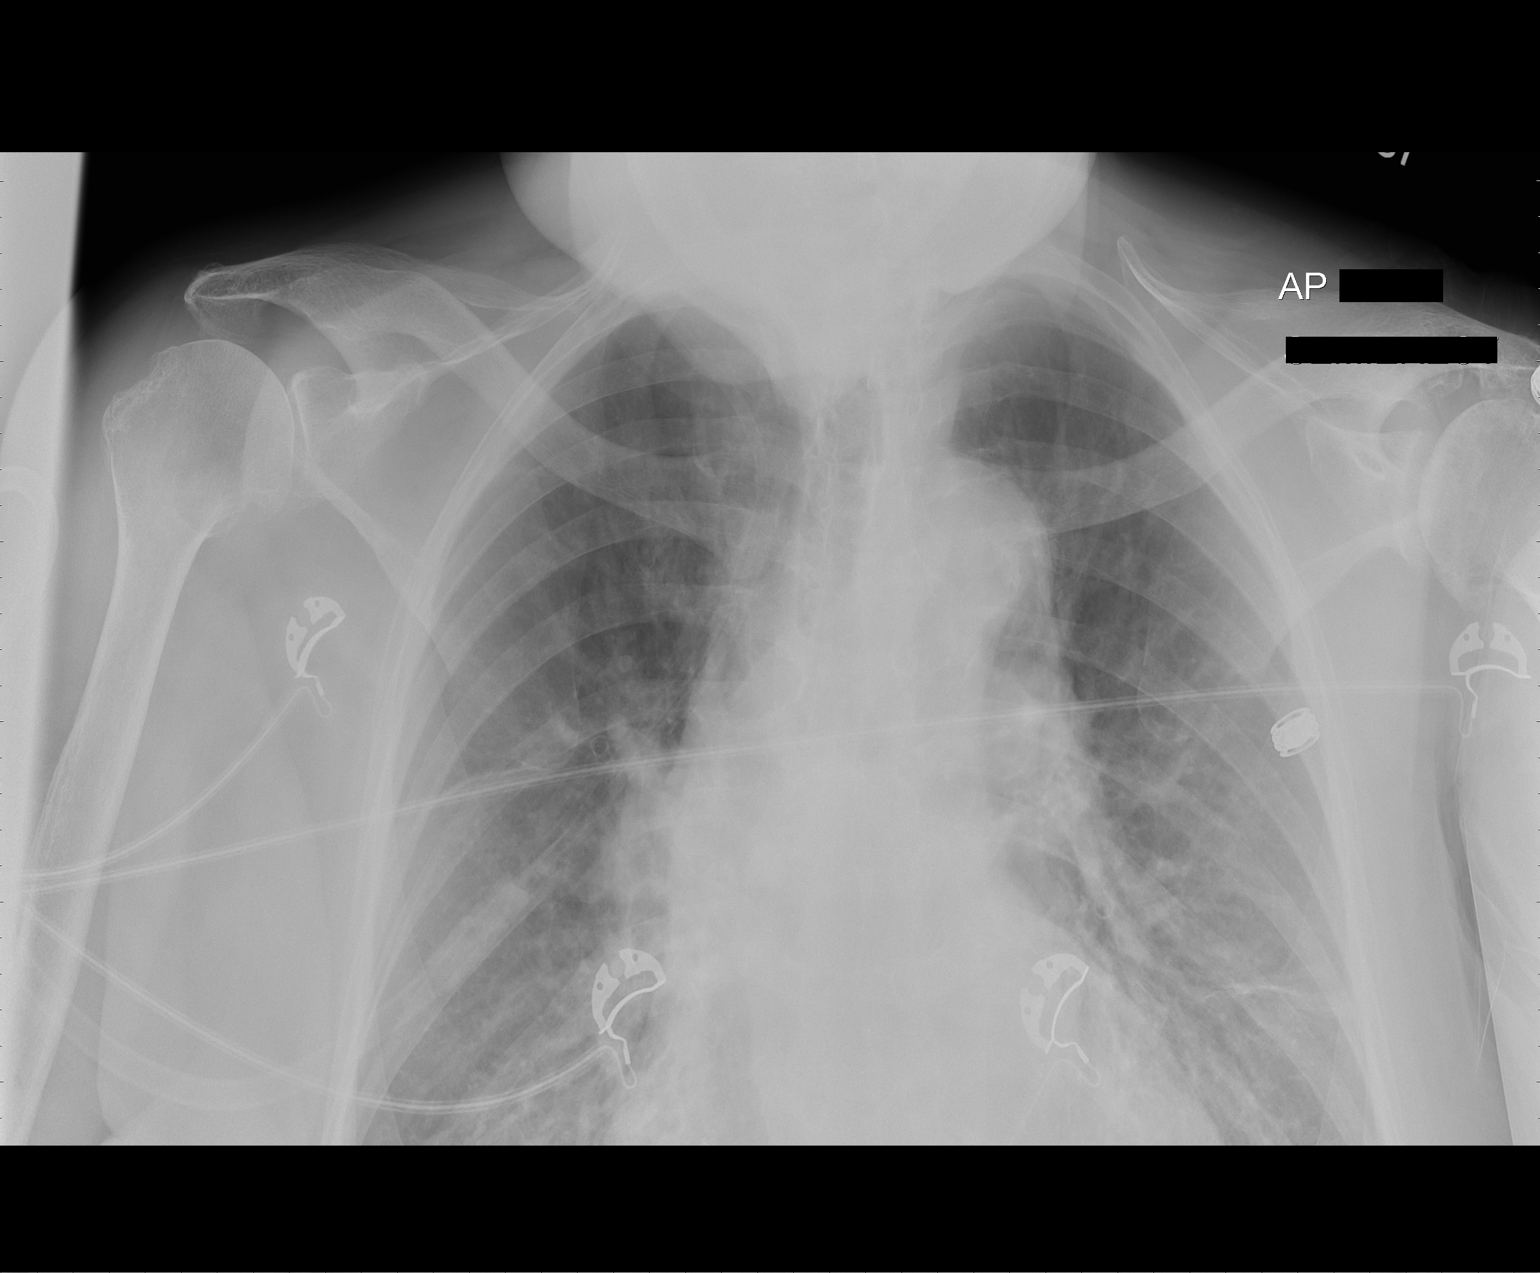

[1 of 1 positions shown; findings below may reference images not displayed]

FINDINGS: The lungs are well-aerated.  There is no evidence of
focal opacification, pleural effusion or pneumothorax. Vascular
congestion is noted, without significant interstitial edema.  Mild
left basilar scarring is noted.

The cardiomediastinal silhouette remains normal in size.  No acute
osseous abnormalities are seen. Degenerative change is noted at the
glenohumeral joints bilaterally.
IMPRESSION: Vascular congestion; lungs remain essentially clear.

## 2010-07-14 ENCOUNTER — Inpatient Hospital Stay (HOSPITAL_COMMUNITY)
Admission: EM | Admit: 2010-07-14 | Discharge: 2010-07-18 | DRG: 378 | Disposition: A | Discharge status: Home / Self Care | Payer: Medicare Other | Attending: Internal Medicine | Admitting: Internal Medicine

## 2010-07-14 DIAGNOSIS — E039 Hypothyroidism, unspecified: Secondary | ICD-10-CM | POA: Diagnosis present

## 2010-07-14 DIAGNOSIS — R197 Diarrhea, unspecified: Secondary | ICD-10-CM | POA: Diagnosis present

## 2010-07-14 DIAGNOSIS — Z86711 Personal history of pulmonary embolism: Secondary | ICD-10-CM

## 2010-07-14 DIAGNOSIS — M069 Rheumatoid arthritis, unspecified: Secondary | ICD-10-CM | POA: Diagnosis present

## 2010-07-14 DIAGNOSIS — D62 Acute posthemorrhagic anemia: Secondary | ICD-10-CM | POA: Diagnosis present

## 2010-07-14 DIAGNOSIS — E785 Hyperlipidemia, unspecified: Secondary | ICD-10-CM | POA: Diagnosis present

## 2010-07-14 DIAGNOSIS — I359 Nonrheumatic aortic valve disorder, unspecified: Secondary | ICD-10-CM | POA: Diagnosis present

## 2010-07-14 DIAGNOSIS — Z8719 Personal history of other diseases of the digestive system: Secondary | ICD-10-CM

## 2010-07-14 DIAGNOSIS — I2510 Atherosclerotic heart disease of native coronary artery without angina pectoris: Secondary | ICD-10-CM | POA: Diagnosis present

## 2010-07-14 DIAGNOSIS — I10 Essential (primary) hypertension: Secondary | ICD-10-CM | POA: Diagnosis present

## 2010-07-14 DIAGNOSIS — F068 Other specified mental disorders due to known physiological condition: Secondary | ICD-10-CM | POA: Diagnosis present

## 2010-07-14 DIAGNOSIS — I5032 Chronic diastolic (congestive) heart failure: Secondary | ICD-10-CM | POA: Diagnosis present

## 2010-07-14 DIAGNOSIS — K922 Gastrointestinal hemorrhage, unspecified: Principal | ICD-10-CM | POA: Diagnosis present

## 2010-07-14 LAB — COMPREHENSIVE METABOLIC PANEL
ALT: 16 U/L (ref 0–35)
AST: 20 U/L (ref 0–37)
Albumin: 3.1 g/dL — ABNORMAL LOW (ref 3.5–5.2)
Alkaline Phosphatase: 56 U/L (ref 39–117)
BUN: 24 mg/dL — ABNORMAL HIGH (ref 6–23)
Calcium: 9.3 mg/dL (ref 8.4–10.5)
GFR calc Af Amer: 59 mL/min — ABNORMAL LOW (ref >60)
GFR calc non Af Amer: 49 mL/min — ABNORMAL LOW (ref >60)
Potassium: 4.4 mEq/L (ref 3.5–5.1)
Sodium: 142 mEq/L (ref 135–145)
Total Bilirubin: 0.6 mg/dL (ref 0.3–1.2)

## 2010-07-14 LAB — CBC
MCHC: 30.7 g/dL (ref 30.0–36.0)
RBC: 3.2 MIL/uL — ABNORMAL LOW (ref 3.87–5.11)
WBC: 6.8 10*3/uL (ref 4.0–10.5)

## 2010-07-14 LAB — ABO/RH: ABO/RH(D): A POS

## 2010-07-14 LAB — OCCULT BLOOD, POC DEVICE: Fecal Occult Bld: POSITIVE

## 2010-07-14 LAB — DIFFERENTIAL
Basophils Absolute: 0 10*3/uL (ref 0.0–0.1)
Monocytes Relative: 6 % (ref 3–12)
Neutro Abs: 5.6 10*3/uL (ref 1.7–7.7)
Neutrophils Relative %: 82 % — ABNORMAL HIGH (ref 43–77)

## 2010-07-14 LAB — PROTIME-INR: INR: 1.08 (ref 0.00–1.49)

## 2010-07-14 LAB — APTT: aPTT: 34 seconds (ref 24–37)

## 2010-07-15 DIAGNOSIS — K625 Hemorrhage of anus and rectum: Secondary | ICD-10-CM

## 2010-07-15 LAB — HEMOGLOBIN AND HEMATOCRIT, BLOOD
HCT: 26.4 % — ABNORMAL LOW (ref 36.0–46.0)
Hemoglobin: 8.1 g/dL — ABNORMAL LOW (ref 12.0–15.0)

## 2010-07-16 LAB — BASIC METABOLIC PANEL
BUN: 14 mg/dL (ref 6–23)
CO2: 27 mEq/L (ref 19–32)
Chloride: 111 mEq/L (ref 96–112)
Creatinine, Ser: 1.07 mg/dL (ref 0.4–1.2)
Potassium: 3.4 mEq/L — ABNORMAL LOW (ref 3.5–5.1)

## 2010-07-16 LAB — CBC
Hemoglobin: 7.9 g/dL — ABNORMAL LOW (ref 12.0–15.0)
MCH: 28.3 pg (ref 26.0–34.0)
MCV: 91.4 fL (ref 78.0–100.0)
RBC: 2.79 MIL/uL — ABNORMAL LOW (ref 3.87–5.11)
WBC: 6 10*3/uL (ref 4.0–10.5)

## 2010-07-17 LAB — TYPE AND SCREEN
DAT, IgG: NEGATIVE
Donor AG Type: NEGATIVE
Donor AG Type: NEGATIVE
Unit division: 0
Unit division: 0

## 2010-07-17 LAB — CBC
HCT: 32.5 % — ABNORMAL LOW (ref 36.0–46.0)
Hemoglobin: 10.2 g/dL — ABNORMAL LOW (ref 12.0–15.0)
MCHC: 31.4 g/dL (ref 30.0–36.0)
MCV: 90 fL (ref 78.0–100.0)
RDW: 15.7 % — ABNORMAL HIGH (ref 11.5–15.5)
WBC: 6.6 10*3/uL (ref 4.0–10.5)

## 2010-07-17 LAB — BASIC METABOLIC PANEL
BUN: 12 mg/dL (ref 6–23)
CO2: 26 mEq/L (ref 19–32)
GFR calc non Af Amer: 48 mL/min — ABNORMAL LOW (ref >60)
Glucose, Bld: 93 mg/dL (ref 70–99)
Potassium: 3.6 mEq/L (ref 3.5–5.1)
Sodium: 144 mEq/L (ref 135–145)

## 2010-07-18 LAB — CBC
HCT: 34.1 % — ABNORMAL LOW (ref 36.0–46.0)
Hemoglobin: 10.6 g/dL — ABNORMAL LOW (ref 12.0–15.0)
MCH: 28.4 pg (ref 26.0–34.0)
MCHC: 31.1 g/dL (ref 30.0–36.0)
MCV: 91.4 fL (ref 78.0–100.0)
RDW: 15.9 % — ABNORMAL HIGH (ref 11.5–15.5)

## 2010-07-18 LAB — BASIC METABOLIC PANEL
BUN: 10 mg/dL (ref 6–23)
CO2: 27 mEq/L (ref 19–32)
Calcium: 8.9 mg/dL (ref 8.4–10.5)
Creatinine, Ser: 1.07 mg/dL (ref 0.4–1.2)
GFR calc non Af Amer: 48 mL/min — ABNORMAL LOW (ref >60)
Glucose, Bld: 86 mg/dL (ref 70–99)
Sodium: 146 mEq/L — ABNORMAL HIGH (ref 135–145)

## 2010-07-30 NOTE — Discharge Summary (Signed)
[name], [name]              ACCOUNT NO.:  [id number]  MEDICAL RECORD NO.:  [id number]           PATIENT TYPE:  I  LOCATION:  1513                         FACILITY:  [facility]  PHYSICIAN:  Ladell Pier, M.D.   DATE OF BIRTH:  Sep 21, 1917  DATE OF ADMISSION:  07/14/2010 DATE OF DISCHARGE:  07/18/2010                              DISCHARGE SUMMARY   DISCHARGE DIAGNOSES: 1. Lower gastrointestinal bleed that resolved on its own. 2. Severe aortic stenosis. 3. Dementia. 4. History of previous multiple pulmonary embolisms, status post     inferior vena cava, not a Coumadin candidate. 5. History of diverticulosis. 6. Hypertension. 7. Diarrhea. 8. Acute blood loss anemia, status post 2 units blood transfusion. 9. History of rheumatoid arthritis. 10.Nonobstructive coronary artery disease. 11.Chronic diastolic failure. 12.History of hyperlipidemia. 13.History of recurrent syncope. 14.History of distal esophageal Mallory Weiss tear and colonic polyps,     status post excision and esophageal ulcer.  DISCHARGE MEDICATIONS: 1. Amlodipine 5 mg daily. 2. Synthroid 137 mcg daily. 3. Accolate 20 mg twice daily. 4. Aricept 10 mg daily at bedtime. 5. Diphenoxylate/atropine 2.5 mg/0.025 mg 1-2 tablets daily as needed. 6. Iron sulfate 325 mg daily. 7. Lasix 20 mg daily. 8. Leflunomide 20 mg daily. 9. Lipitor 20 mg at bedtime. 10.Labetalol 200 mg half twice daily. 11.Nexium 40 mg daily. 12.Potassium chloride 20 mEq every other day. 13.TobraDex 1 drop in both eyes 4 times daily. 14.Vitamin C 500 mg daily. 15.Vitamin D2 is 50,000 units by mouth weekly.  FOLLOWUP APPOINTMENTS:  The patient to follow up with Dr. Margaretmary Bayley in 1 week.  PROCEDURES:  None.  CONSULTANTS:  GI, Rachael Fee, M.D.  HISTORY OF PRESENT ILLNESS:  The patient is a 77 year old female with a history of severe aortic stenosis, dementia, history of pulmonary embolism status post inferior vena cava, previously  on Coumadin but presently not a Coumadin candidate secondary to GI bleed 1 year ago. She has diarrhea for an unknown amount of time and saw her primary care physician, Dr. Margaretmary Bayley and then was here for further evaluation. During her stay in the emergency room, she was hemodynamically stable, hemoglobin 8.9, normal white count, and normal BUN.  She denied any pain or shortness of breath.  PAST MEDICAL HISTORY, FAMILY HISTORY, SOCIAL HISTORY, MEDICATIONS, ALLERGIES:  As per admission H and P.  PHYSICAL EXAMINATION ON DISCHARGE:  VITAL SIGNS:  Temperature 98.4, pulse of 80, respirations 22, blood pressure 171/78, pulse ox 97% on room air. GENERAL:  The patient is sitting up in bed, well-nourished African American female. HEENT:  Normocephalic, atraumatic.  Pupils reactive to light without erythema. CARDIOVASCULAR:  Regular rate and rhythm with a 2/6 systolic murmur. LUNGS:  Clear bilaterally. ABDOMEN:  Positive bowel sounds. EXTREMITIES:  Without edema.  HOSPITAL COURSE: 1. GI bleed.  The patient was admitted to the hospital, managed     conservatively with bowel rest/clear liquid diet and advance as     tolerated, and PPI.  GI was consulted and did not think the patient     needs another colonoscopy/endoscopy at this time as she just had  one last year.  They recommend conservatively just watching the     patient for any further bleed.  The patient as mentioned before had     no further bleeding during inpatient.  She was transfused 2 units     of blood, and her hemoglobin remained stable over 24-48 hours, so     she will follow up with Dr. Margaretmary Bayley in the office and follow     up with her gastroenterologist as needed. 2. Hypertension.  Her blood pressure was running extremely high     inhouse up to the 180s systolic, so Norvasc was added and with     Norvasc added, it dropped her blood pressure to the 150s.  She did     not mention to me that in the past she has had  problems with     syncopal episode with her blood pressure drops too low, so just     added the 5 mg of Norvasc and that can be watched and adjusted     accordingly by Dr. Margaretmary Bayley, her PCP. 3. Severe stenosis, that remained stable throughout her     hospitalization. 4. Acute blood loss anemia.  She was transfused 2 units as mentioned     before, hemoglobin is stable. 5. Rheumatoid arthritis.  She was continued on her steroids. 6. Hypothyroidism.  Her TSH was elevated, so Synthroid was increased.  DISCHARGE LABS:  Sodium 146, potassium 4.2, chloride 111, CO2 of 27, glucose 86, BUN 10, creatinine 1.07, and calcium 8.9.  WBC 6.6, hemoglobin 10.6, MCV 91.4, platelets 155,000, TSH 22.568.  TIME SPENT:  With the patient and daughter and doing this discharge is approximately 45 minutes.     Ladell Pier, M.D.     N[place]/MEDQ  D:  07/18/2010  T:  07/18/2010  Job:  [id number]  cc:   Margaretmary Bayley, M.D. Fax: [phone]  Electronically Signed by Ladell Pier M.D. on 07/29/2010 07:56:20 AM

## 2010-08-02 NOTE — H&P (Signed)
[name], [name]              ACCOUNT NO.:  [id number]  MEDICAL RECORD NO.:  [id number]           PATIENT TYPE:  E  LOCATION:  WLED                         FACILITY:  [facility]  PHYSICIAN:  Houston Siren, MD           DATE OF BIRTH:  Apr 12, 1918  DATE OF ADMISSION:  07/14/2010 DATE OF DISCHARGE:                             HISTORY & PHYSICAL   PRIMARY CARE PHYSICIAN:  Margaretmary Bayley, MD  ADVANCE DIRECTIVE:  Full code.  This was reconfirmed tonight.  REASON FOR ADMISSION:  Lower GI bleed.  HISTORY OF PRESENT ILLNESS:  This is a 77 year old female with  history of severe aortic stenosis (peak gradient 78 mmHg with a valvular area of 0.99), dementia, history of pulmonary embolism, status post inferior vena cava and previous Coumadin, history of upper GI bleed (esophageal ulcer and diverticulosis by colonoscopy) approximately 1 year ago, history of nonobstructive coronary artery disease, hypercholesterolemia, hypertension, who presents with diarrhea and bright red blood per rectum.  She had diarrhea for an unknown amount of time and saw her primary care physician, Dr. Margaretmary Bayley, today and was sent here for further evaluation.  During her stay in the emergency room, she maintained hemodynamic stability and, if anything, she was hypertensive. Workup showed hemoglobin of 8.9 which has been stable, normal white count of 6800.  Her BUN is 24.  Her liver function tests are normal. Albumin is 3.1 and her INR is 1.2.  She denies any shortness of breath, chest pain, nausea or vomiting, abdominal cramps or pain, or any other symptomatology.  PAST MEDICAL HISTORY:  Severe aortic stenosis, pulmonary embolism on prior anticoagulation; she has not taken anticoagulation for about a year; history of rheumatoid arthritis; nonobstructive coronary artery disease; chronic diastolic heart failure; history of hyperlipidemia; hypertension; recurrent syncope, multifactorial; distal  esophageal Mallory-Weiss tear; colonic polyps, status post excision; and esophageal ulcer.  CURRENT MEDICATIONS: 1. Lasix 20 mg per day. 2. TobraDex. 3. Nexium. 4. Vitamin D. 5. Synthroid 125 mcg per day. 6. Prednisone 7.5 mg daily. 7. Potassium supplement. 8. Lipitor 20 mg per day. 9. Leflunomide 20 mg daily. 10.Labetalol 100 mg b.i.d. 11.Ferrous sulfate. 12.Atropine and diphenoxylate. 13.Vitamin C. 14.Aricept 10 mg daily. 15.Accolate 20 mg per day.  SOCIAL HISTORY:  She denied tobacco or alcohol use.  She lives with her caregiver.  PHYSICAL EXAM:  GENERAL:  She is alert and oriented but she thinks she is at [facility] while she is at [employer].  She recognized her daughter but she does not know why she is here.  HEENT:  Sclerae are nonicteric.  Conjunctivae was pink.  NECK:  Supple.  No lymphadenopathy. No JVD.  HEART:  S1, S2 regular.  I did not hear any murmur, rub or gallop.  LUNGS:  Clear.  No wheezes, rales or rhonchi.  ABDOMEN:  Soft, nondistended, nontender.  Normal bowel sounds.  EXTREMITIES:  She does have pain to touch at trigger points.  No edema in the lower extremities.  Strength is good bilaterally.  LABORATORY STUDIES:  White count of 6800, hemoglobin of 8.9, platelet count of 188,000.  Serum  sodium of 142, potassium 4.4, CO2 of 27, glucose 109, creatinine 1.06, albumin 3.1, INR 1.08.  Guaiac stool is positive.  IMPRESSION AND PLAN:  This is a 77 year old female with severe aortic stenosis, dementia, prior pulmonary embolism, status post inferior vena cava, with history of prior upper GI bleed and history of diverticulosis presents with lower GI bleed.  She has some diarrhea and because of that I cannot exclude infectious colitis, IBD and ischemic colitis.  However, I think that this is likely a diverticular bleed.  We will admit her to telemetry.  Will do serial H and H q.8 h.  We will transfuse if her hemoglobin is less than 8.  I have spoken with  the gastroenterologist, Dr. Leone Payor, who will see her tomorrow.  For completeness of the workup, we will send stool for culture and sensitivity and Clostridium difficile.  I would like to continue all her medications except for the Lasix.  Will go ahead and continue her beta blocker as she is hypertensive.  She is a full code as it was confirmed tonight and we will honor it.  PROBLEM LIST: 1. Lower GI bleed. 2. Severe aortic stenosis. 3. Dementia. 4. Previous multiple PE, status post IVC.  Not on Coumadin at this     time. 5. History of diverticulosis. 6. Hypertension. 7. Diarrhea. 8. Full code.     Houston Siren, MD     PL/MEDQ  D:  07/14/2010  T:  07/14/2010  Job:  [id number]  cc:   Margaretmary Bayley, M.D. Fax: [phone]  Iva Boop, MD,FACG [facility] [address] [place], [place] [address]  Electronically Signed by Houston Siren  on 08/02/2010 06:50:15 AM

## 2010-08-09 LAB — CBC
HCT: 31.5 % — ABNORMAL LOW (ref 36.0–46.0)
Hemoglobin: 10.2 g/dL — ABNORMAL LOW (ref 12.0–15.0)
Hemoglobin: 10 g/dL — ABNORMAL LOW (ref 12.0–15.0)
MCHC: 32.3 g/dL (ref 30.0–36.0)
MCV: 80.3 fL (ref 78.0–100.0)
Platelets: 269 10*3/uL (ref 150–400)
RBC: 3.93 MIL/uL (ref 3.87–5.11)
RBC: 3.98 MIL/uL (ref 3.87–5.11)
RDW: 16.1 % — ABNORMAL HIGH (ref 11.5–15.5)
WBC: 7.9 K/uL (ref 4.0–10.5)
WBC: 5.6 10*3/uL (ref 4.0–10.5)

## 2010-08-09 LAB — URINALYSIS, ROUTINE W REFLEX MICROSCOPIC
Bilirubin Urine: NEGATIVE
Glucose, UA: NEGATIVE mg/dL
Ketones, ur: NEGATIVE mg/dL
Nitrite: POSITIVE — AB
Protein, ur: NEGATIVE mg/dL
Specific Gravity, Urine: 1.013 (ref 1.005–1.030)
Urobilinogen, UA: 0.2 mg/dL (ref 0.0–1.0)
pH: 6 (ref 5.0–8.0)

## 2010-08-09 LAB — COMPREHENSIVE METABOLIC PANEL WITH GFR
AST: 19 U/L (ref 0–37)
BUN: 15 mg/dL (ref 6–23)
CO2: 29 meq/L (ref 19–32)
Chloride: 106 meq/L (ref 96–112)
Creatinine, Ser: 1.1 mg/dL (ref 0.4–1.2)
GFR calc non Af Amer: 47 mL/min — ABNORMAL LOW (ref >60)
Total Bilirubin: 0.4 mg/dL (ref 0.3–1.2)

## 2010-08-09 LAB — DIFFERENTIAL
Basophils Absolute: 0.1 K/uL (ref 0.0–0.1)
Basophils Relative: 2 % — ABNORMAL HIGH (ref 0–1)
Eosinophils Absolute: 0 10*3/uL (ref 0.0–0.7)
Eosinophils Relative: 0 % (ref 0–5)
Lymphocytes Relative: 11 % — ABNORMAL LOW (ref 12–46)
Lymphs Abs: 0.9 10*3/uL (ref 0.7–4.0)
Monocytes Absolute: 0.4 10*3/uL (ref 0.1–1.0)
Monocytes Relative: 5 % (ref 3–12)
Neutro Abs: 6.5 K/uL (ref 1.7–7.7)
Neutrophils Relative %: 82 % — ABNORMAL HIGH (ref 43–77)

## 2010-08-09 LAB — COMPREHENSIVE METABOLIC PANEL
ALT: 16 U/L (ref 0–35)
Albumin: 3.7 g/dL (ref 3.5–5.2)
Alkaline Phosphatase: 82 U/L (ref 39–117)
Calcium: 9.1 mg/dL (ref 8.4–10.5)
GFR calc Af Amer: 57 mL/min — ABNORMAL LOW (ref >60)
Glucose, Bld: 100 mg/dL — ABNORMAL HIGH (ref 70–99)
Potassium: 4.2 mEq/L (ref 3.5–5.1)
Sodium: 141 mEq/L (ref 135–145)
Total Protein: 6.8 g/dL (ref 6.0–8.3)

## 2010-08-09 LAB — URINE MICROSCOPIC-ADD ON

## 2010-08-09 LAB — POCT CARDIAC MARKERS
CKMB, poc: <1 ng/mL — ABNORMAL LOW (ref 1.0–8.0)
CKMB, poc: <1 ng/mL — ABNORMAL LOW (ref 1.0–8.0)
Myoglobin, poc: 76.7 ng/mL (ref 12–200)
Myoglobin, poc: 80.1 ng/mL (ref 12–200)
Troponin i, poc: <0.05 ng/mL (ref 0.00–0.09)
Troponin i, poc: <0.05 ng/mL (ref 0.00–0.09)

## 2010-08-09 LAB — PROTIME-INR
INR: 2.15 — ABNORMAL HIGH (ref 0.00–1.49)
Prothrombin Time: 23.8 seconds — ABNORMAL HIGH (ref 11.6–15.2)

## 2010-08-09 LAB — VITAMIN B12: Vitamin B-12: 481 pg/mL (ref 211–911)

## 2010-08-09 LAB — LIPASE, BLOOD: Lipase: 84 U/L (ref 23–300)

## 2010-08-10 LAB — COMPREHENSIVE METABOLIC PANEL
AST: 20 U/L (ref 0–37)
Albumin: 2.9 g/dL — ABNORMAL LOW (ref 3.5–5.2)
Alkaline Phosphatase: 47 U/L (ref 39–117)
BUN: 7 mg/dL (ref 6–23)
Chloride: 107 mEq/L (ref 96–112)
GFR calc Af Amer: >60 mL/min (ref >60)
Potassium: 4.5 mEq/L (ref 3.5–5.1)
Total Bilirubin: 0.6 mg/dL (ref 0.3–1.2)

## 2010-08-10 LAB — CBC
HCT: 26.9 % — ABNORMAL LOW (ref 36.0–46.0)
HCT: 27.6 % — ABNORMAL LOW (ref 36.0–46.0)
Hemoglobin: 9.1 g/dL — ABNORMAL LOW (ref 12.0–15.0)
Platelets: 270 10*3/uL (ref 150–400)
Platelets: 274 10*3/uL (ref 150–400)
RBC: 3.33 MIL/uL — ABNORMAL LOW (ref 3.87–5.11)
RBC: 3.42 MIL/uL — ABNORMAL LOW (ref 3.87–5.11)
WBC: 6.4 10*3/uL (ref 4.0–10.5)
WBC: 6.6 10*3/uL (ref 4.0–10.5)

## 2010-08-10 LAB — CARDIAC PANEL(CRET KIN+CKTOT+MB+TROPI)
CK, MB: 0.5 ng/mL (ref 0.3–4.0)
CK, MB: 0.5 ng/mL (ref 0.3–4.0)
Relative Index: INVALID (ref 0.0–2.5)
Total CK: 32 U/L (ref 7–177)
Troponin I: 0.01 ng/mL (ref 0.00–0.06)
Troponin I: 0.03 ng/mL (ref 0.00–0.06)

## 2010-08-10 LAB — POCT CARDIAC MARKERS: Troponin i, poc: <0.05 ng/mL (ref 0.00–0.09)

## 2010-08-10 LAB — DIFFERENTIAL
Eosinophils Relative: 1 % (ref 0–5)
Lymphocytes Relative: 29 % (ref 12–46)
Lymphs Abs: 1.8 10*3/uL (ref 0.7–4.0)
Monocytes Relative: 9 % (ref 3–12)

## 2010-08-10 LAB — PROTIME-INR: Prothrombin Time: 22.8 seconds — ABNORMAL HIGH (ref 11.6–15.2)

## 2010-08-10 LAB — BASIC METABOLIC PANEL
BUN: 6 mg/dL (ref 6–23)
GFR calc Af Amer: >60 mL/min (ref >60)
GFR calc non Af Amer: 54 mL/min — ABNORMAL LOW (ref >60)
Potassium: 3.3 mEq/L — ABNORMAL LOW (ref 3.5–5.1)
Sodium: 143 mEq/L (ref 135–145)

## 2010-08-11 LAB — URINE MICROSCOPIC-ADD ON

## 2010-08-11 LAB — HEMOGLOBIN AND HEMATOCRIT, BLOOD
HCT: 27.4 % — ABNORMAL LOW (ref 36.0–46.0)
HCT: 29.9 % — ABNORMAL LOW (ref 36.0–46.0)
HCT: 31.8 % — ABNORMAL LOW (ref 36.0–46.0)
Hemoglobin: 10.3 g/dL — ABNORMAL LOW (ref 12.0–15.0)
Hemoglobin: 11.1 g/dL — ABNORMAL LOW (ref 12.0–15.0)
Hemoglobin: 9.1 g/dL — ABNORMAL LOW (ref 12.0–15.0)

## 2010-08-11 LAB — CBC
HCT: 26.1 % — ABNORMAL LOW (ref 36.0–46.0)
HCT: 26.8 % — ABNORMAL LOW (ref 36.0–46.0)
HCT: 27.2 % — ABNORMAL LOW (ref 36.0–46.0)
HCT: 27.3 % — ABNORMAL LOW (ref 36.0–46.0)
HCT: 28 % — ABNORMAL LOW (ref 36.0–46.0)
HCT: 29.8 % — ABNORMAL LOW (ref 36.0–46.0)
HCT: 31.1 % — ABNORMAL LOW (ref 36.0–46.0)
Hemoglobin: 10.1 g/dL — ABNORMAL LOW (ref 12.0–15.0)
Hemoglobin: 10.2 g/dL — ABNORMAL LOW (ref 12.0–15.0)
Hemoglobin: 8.4 g/dL — ABNORMAL LOW (ref 12.0–15.0)
Hemoglobin: 8.6 g/dL — ABNORMAL LOW (ref 12.0–15.0)
Hemoglobin: 9 g/dL — ABNORMAL LOW (ref 12.0–15.0)
MCHC: 31.9 g/dL (ref 30.0–36.0)
MCHC: 32.3 g/dL (ref 30.0–36.0)
MCHC: 32.3 g/dL (ref 30.0–36.0)
MCV: 82.1 fL (ref 78.0–100.0)
MCV: 82.1 fL (ref 78.0–100.0)
MCV: 82.2 fL (ref 78.0–100.0)
MCV: 82.3 fL (ref 78.0–100.0)
MCV: 82.6 fL (ref 78.0–100.0)
MCV: 82.8 fL (ref 78.0–100.0)
MCV: 83.1 fL (ref 78.0–100.0)
Platelets: 208 10*3/uL (ref 150–400)
Platelets: 213 10*3/uL (ref 150–400)
Platelets: 226 10*3/uL (ref 150–400)
Platelets: 230 10*3/uL (ref 150–400)
Platelets: 266 10*3/uL (ref 150–400)
RBC: 3.06 MIL/uL — ABNORMAL LOW (ref 3.87–5.11)
RBC: 3.18 MIL/uL — ABNORMAL LOW (ref 3.87–5.11)
RBC: 3.29 MIL/uL — ABNORMAL LOW (ref 3.87–5.11)
RBC: 3.29 MIL/uL — ABNORMAL LOW (ref 3.87–5.11)
RBC: 3.79 MIL/uL — ABNORMAL LOW (ref 3.87–5.11)
RBC: 3.82 MIL/uL — ABNORMAL LOW (ref 3.87–5.11)
RDW: 19 % — ABNORMAL HIGH (ref 11.5–15.5)
RDW: 19.1 % — ABNORMAL HIGH (ref 11.5–15.5)
RDW: 19.4 % — ABNORMAL HIGH (ref 11.5–15.5)
RDW: 19.6 % — ABNORMAL HIGH (ref 11.5–15.5)
WBC: 6.1 10*3/uL (ref 4.0–10.5)
WBC: 6.4 10*3/uL (ref 4.0–10.5)
WBC: 6.4 10*3/uL (ref 4.0–10.5)
WBC: 7 10*3/uL (ref 4.0–10.5)
WBC: 7.1 10*3/uL (ref 4.0–10.5)
WBC: 7.9 10*3/uL (ref 4.0–10.5)

## 2010-08-11 LAB — GLUCOSE, CAPILLARY
Glucose-Capillary: 106 mg/dL — ABNORMAL HIGH (ref 70–99)
Glucose-Capillary: 140 mg/dL — ABNORMAL HIGH (ref 70–99)

## 2010-08-11 LAB — BASIC METABOLIC PANEL
BUN: 9 mg/dL (ref 6–23)
CO2: 26 mEq/L (ref 19–32)
CO2: 26 mEq/L (ref 19–32)
CO2: 28 mEq/L (ref 19–32)
Calcium: 8.8 mg/dL (ref 8.4–10.5)
Chloride: 109 mEq/L (ref 96–112)
Chloride: 110 mEq/L (ref 96–112)
Creatinine, Ser: 0.99 mg/dL (ref 0.4–1.2)
GFR calc Af Amer: >60 mL/min (ref >60)
GFR calc non Af Amer: >60 mL/min (ref >60)
Glucose, Bld: 87 mg/dL (ref 70–99)
Glucose, Bld: 93 mg/dL (ref 70–99)
Potassium: 3.2 mEq/L — ABNORMAL LOW (ref 3.5–5.1)
Potassium: 3.6 mEq/L (ref 3.5–5.1)
Sodium: 143 mEq/L (ref 135–145)

## 2010-08-11 LAB — CROSSMATCH
ABO/RH(D): A POS
Antibody Screen: POSITIVE
Donor AG Type: NEGATIVE

## 2010-08-11 LAB — URINALYSIS, ROUTINE W REFLEX MICROSCOPIC
Glucose, UA: NEGATIVE mg/dL
Ketones, ur: 15 mg/dL — AB
pH: 6 (ref 5.0–8.0)

## 2010-08-11 LAB — PROTIME-INR
INR: 1.04 (ref 0.00–1.49)
INR: 1.11 (ref 0.00–1.49)
INR: 1.2 (ref 0.00–1.49)
INR: 1.35 (ref 0.00–1.49)
INR: 1.66 — ABNORMAL HIGH (ref 0.00–1.49)
Prothrombin Time: 19.5 seconds — ABNORMAL HIGH (ref 11.6–15.2)

## 2010-08-11 LAB — HEPARIN LEVEL (UNFRACTIONATED)
Heparin Unfractionated: 0.25 IU/mL — ABNORMAL LOW (ref 0.30–0.70)
Heparin Unfractionated: 0.36 IU/mL (ref 0.30–0.70)
Heparin Unfractionated: 0.38 IU/mL (ref 0.30–0.70)
Heparin Unfractionated: 0.38 IU/mL (ref 0.30–0.70)
Heparin Unfractionated: 0.49 IU/mL (ref 0.30–0.70)
Heparin Unfractionated: 0.8 IU/mL — ABNORMAL HIGH (ref 0.30–0.70)

## 2010-08-11 LAB — URINE CULTURE: Special Requests: POSITIVE

## 2010-08-13 LAB — BASIC METABOLIC PANEL
BUN: 13 mg/dL (ref 6–23)
BUN: 4 mg/dL — ABNORMAL LOW (ref 6–23)
BUN: 6 mg/dL (ref 6–23)
BUN: 7 mg/dL (ref 6–23)
BUN: 7 mg/dL (ref 6–23)
BUN: 8 mg/dL (ref 6–23)
CO2: 23 mEq/L (ref 19–32)
CO2: 25 mEq/L (ref 19–32)
CO2: 26 mEq/L (ref 19–32)
CO2: 26 mEq/L (ref 19–32)
CO2: 26 mEq/L (ref 19–32)
CO2: 26 mEq/L (ref 19–32)
CO2: 27 mEq/L (ref 19–32)
CO2: 28 mEq/L (ref 19–32)
Calcium: 8.6 mg/dL (ref 8.4–10.5)
Calcium: 8.9 mg/dL (ref 8.4–10.5)
Calcium: 9 mg/dL (ref 8.4–10.5)
Calcium: 9.1 mg/dL (ref 8.4–10.5)
Calcium: 9.2 mg/dL (ref 8.4–10.5)
Calcium: 9.4 mg/dL (ref 8.4–10.5)
Chloride: 106 mEq/L (ref 96–112)
Chloride: 110 mEq/L (ref 96–112)
Chloride: 112 mEq/L (ref 96–112)
Creatinine, Ser: 0.84 mg/dL (ref 0.4–1.2)
Creatinine, Ser: 0.88 mg/dL (ref 0.4–1.2)
Creatinine, Ser: 0.98 mg/dL (ref 0.4–1.2)
Creatinine, Ser: 1 mg/dL (ref 0.4–1.2)
Creatinine, Ser: 1.06 mg/dL (ref 0.4–1.2)
Creatinine, Ser: 1.16 mg/dL (ref 0.4–1.2)
GFR calc Af Amer: 53 mL/min — ABNORMAL LOW (ref >60)
GFR calc Af Amer: 59 mL/min — ABNORMAL LOW (ref >60)
GFR calc Af Amer: >60 mL/min (ref >60)
GFR calc Af Amer: >60 mL/min (ref >60)
GFR calc Af Amer: >60 mL/min (ref >60)
GFR calc non Af Amer: 49 mL/min — ABNORMAL LOW (ref >60)
GFR calc non Af Amer: 53 mL/min — ABNORMAL LOW (ref >60)
GFR calc non Af Amer: 53 mL/min — ABNORMAL LOW (ref >60)
GFR calc non Af Amer: 54 mL/min — ABNORMAL LOW (ref >60)
GFR calc non Af Amer: >60 mL/min (ref >60)
GFR calc non Af Amer: >60 mL/min (ref >60)
Glucose, Bld: 70 mg/dL (ref 70–99)
Glucose, Bld: 79 mg/dL (ref 70–99)
Glucose, Bld: 81 mg/dL (ref 70–99)
Glucose, Bld: 83 mg/dL (ref 70–99)
Glucose, Bld: 84 mg/dL (ref 70–99)
Glucose, Bld: 85 mg/dL (ref 70–99)
Glucose, Bld: 95 mg/dL (ref 70–99)
Potassium: 3.5 mEq/L (ref 3.5–5.1)
Potassium: 3.6 mEq/L (ref 3.5–5.1)
Potassium: 3.6 mEq/L (ref 3.5–5.1)
Potassium: 3.8 mEq/L (ref 3.5–5.1)
Sodium: 142 mEq/L (ref 135–145)
Sodium: 142 mEq/L (ref 135–145)
Sodium: 142 mEq/L (ref 135–145)
Sodium: 143 mEq/L (ref 135–145)
Sodium: 144 mEq/L (ref 135–145)

## 2010-08-13 LAB — PROTIME-INR
INR: 0.98 (ref 0.00–1.49)
INR: 1.12 (ref 0.00–1.49)
INR: 1.18 (ref 0.00–1.49)
INR: 3.69 — ABNORMAL HIGH (ref 0.00–1.49)
Prothrombin Time: 12.9 seconds (ref 11.6–15.2)
Prothrombin Time: 13.5 seconds (ref 11.6–15.2)
Prothrombin Time: 14.9 seconds (ref 11.6–15.2)
Prothrombin Time: 16 seconds — ABNORMAL HIGH (ref 11.6–15.2)
Prothrombin Time: 17.3 seconds — ABNORMAL HIGH (ref 11.6–15.2)

## 2010-08-13 LAB — CBC
HCT: 27.8 % — ABNORMAL LOW (ref 36.0–46.0)
HCT: 29.4 % — ABNORMAL LOW (ref 36.0–46.0)
HCT: 29.7 % — ABNORMAL LOW (ref 36.0–46.0)
HCT: 30.4 % — ABNORMAL LOW (ref 36.0–46.0)
HCT: 30.8 % — ABNORMAL LOW (ref 36.0–46.0)
HCT: 32.1 % — ABNORMAL LOW (ref 36.0–46.0)
HCT: 33.9 % — ABNORMAL LOW (ref 36.0–46.0)
Hemoglobin: 10.3 g/dL — ABNORMAL LOW (ref 12.0–15.0)
Hemoglobin: 8.2 g/dL — ABNORMAL LOW (ref 12.0–15.0)
Hemoglobin: 8.3 g/dL — ABNORMAL LOW (ref 12.0–15.0)
Hemoglobin: 8.9 g/dL — ABNORMAL LOW (ref 12.0–15.0)
Hemoglobin: 9.7 g/dL — ABNORMAL LOW (ref 12.0–15.0)
Hemoglobin: 9.7 g/dL — ABNORMAL LOW (ref 12.0–15.0)
Hemoglobin: 9.8 g/dL — ABNORMAL LOW (ref 12.0–15.0)
Hemoglobin: 9.9 g/dL — ABNORMAL LOW (ref 12.0–15.0)
MCHC: 31.9 g/dL (ref 30.0–36.0)
MCHC: 32 g/dL (ref 30.0–36.0)
MCHC: 32.1 g/dL (ref 30.0–36.0)
MCHC: 32.5 g/dL (ref 30.0–36.0)
MCHC: 32.6 g/dL (ref 30.0–36.0)
MCHC: 32.7 g/dL (ref 30.0–36.0)
MCV: 80.9 fL (ref 78.0–100.0)
MCV: 81.3 fL (ref 78.0–100.0)
MCV: 81.3 fL (ref 78.0–100.0)
MCV: 81.5 fL (ref 78.0–100.0)
MCV: 81.8 fL (ref 78.0–100.0)
MCV: 82 fL (ref 78.0–100.0)
Platelets: 202 10*3/uL (ref 150–400)
Platelets: 221 10*3/uL (ref 150–400)
Platelets: 222 10*3/uL (ref 150–400)
Platelets: 224 10*3/uL (ref 150–400)
Platelets: 232 10*3/uL (ref 150–400)
Platelets: 234 10*3/uL (ref 150–400)
Platelets: 250 10*3/uL (ref 150–400)
Platelets: 272 10*3/uL (ref 150–400)
RBC: 3.18 MIL/uL — ABNORMAL LOW (ref 3.87–5.11)
RBC: 3.62 MIL/uL — ABNORMAL LOW (ref 3.87–5.11)
RBC: 3.63 MIL/uL — ABNORMAL LOW (ref 3.87–5.11)
RBC: 3.68 MIL/uL — ABNORMAL LOW (ref 3.87–5.11)
RBC: 3.74 MIL/uL — ABNORMAL LOW (ref 3.87–5.11)
RBC: 4.01 MIL/uL (ref 3.87–5.11)
RDW: 16.7 % — ABNORMAL HIGH (ref 11.5–15.5)
RDW: 16.9 % — ABNORMAL HIGH (ref 11.5–15.5)
RDW: 17 % — ABNORMAL HIGH (ref 11.5–15.5)
RDW: 17.1 % — ABNORMAL HIGH (ref 11.5–15.5)
RDW: 17.2 % — ABNORMAL HIGH (ref 11.5–15.5)
RDW: 17.3 % — ABNORMAL HIGH (ref 11.5–15.5)
RDW: 18.2 % — ABNORMAL HIGH (ref 11.5–15.5)
RDW: 19 % — ABNORMAL HIGH (ref 11.5–15.5)
WBC: 6.5 10*3/uL (ref 4.0–10.5)
WBC: 7 10*3/uL (ref 4.0–10.5)
WBC: 7.2 10*3/uL (ref 4.0–10.5)
WBC: 7.3 10*3/uL (ref 4.0–10.5)
WBC: 7.6 10*3/uL (ref 4.0–10.5)
WBC: 8 10*3/uL (ref 4.0–10.5)
WBC: 8.1 10*3/uL (ref 4.0–10.5)
WBC: 8.4 10*3/uL (ref 4.0–10.5)

## 2010-08-13 LAB — COMPREHENSIVE METABOLIC PANEL
ALT: 8 U/L (ref 0–35)
AST: 18 U/L (ref 0–37)
AST: 19 U/L (ref 0–37)
Albumin: 2.8 g/dL — ABNORMAL LOW (ref 3.5–5.2)
Alkaline Phosphatase: 57 U/L (ref 39–117)
CO2: 26 mEq/L (ref 19–32)
Chloride: 108 mEq/L (ref 96–112)
Chloride: 108 mEq/L (ref 96–112)
GFR calc Af Amer: 55 mL/min — ABNORMAL LOW (ref >60)
GFR calc Af Amer: 58 mL/min — ABNORMAL LOW (ref >60)
GFR calc non Af Amer: 48 mL/min — ABNORMAL LOW (ref >60)
Glucose, Bld: 90 mg/dL (ref 70–99)
Potassium: 3.5 mEq/L (ref 3.5–5.1)
Sodium: 143 mEq/L (ref 135–145)
Sodium: 144 mEq/L (ref 135–145)
Total Bilirubin: 0.3 mg/dL (ref 0.3–1.2)
Total Bilirubin: 0.4 mg/dL (ref 0.3–1.2)
Total Protein: 6.3 g/dL (ref 6.0–8.3)

## 2010-08-13 LAB — URINALYSIS, ROUTINE W REFLEX MICROSCOPIC
Bilirubin Urine: NEGATIVE
Hgb urine dipstick: NEGATIVE
Ketones, ur: NEGATIVE mg/dL
Nitrite: NEGATIVE
Protein, ur: NEGATIVE mg/dL
Urobilinogen, UA: 0.2 mg/dL (ref 0.0–1.0)

## 2010-08-13 LAB — CROSSMATCH
DAT, IgG: NEGATIVE
PT AG Type: NEGATIVE

## 2010-08-13 LAB — CK TOTAL AND CKMB (NOT AT ARMC)
CK, MB: 0.7 ng/mL (ref 0.3–4.0)
Relative Index: INVALID (ref 0.0–2.5)
Relative Index: INVALID (ref 0.0–2.5)
Total CK: 33 U/L (ref 7–177)
Total CK: 62 U/L (ref 7–177)

## 2010-08-13 LAB — RETICULOCYTES: Retic Count, Absolute: 40.2 10*3/uL (ref 19.0–186.0)

## 2010-08-13 LAB — TSH: TSH: 0.762 u[IU]/mL (ref 0.350–4.500)

## 2010-08-13 LAB — TROPONIN I: Troponin I: 0.07 ng/mL — ABNORMAL HIGH (ref 0.00–0.06)

## 2010-08-13 LAB — HEMOGLOBIN AND HEMATOCRIT, BLOOD
HCT: 30 % — ABNORMAL LOW (ref 36.0–46.0)
HCT: 32.1 % — ABNORMAL LOW (ref 36.0–46.0)
Hemoglobin: 10.5 g/dL — ABNORMAL LOW (ref 12.0–15.0)
Hemoglobin: 9.4 g/dL — ABNORMAL LOW (ref 12.0–15.0)
Hemoglobin: 9.5 g/dL — ABNORMAL LOW (ref 12.0–15.0)
Hemoglobin: 9.6 g/dL — ABNORMAL LOW (ref 12.0–15.0)
Hemoglobin: 9.9 g/dL — ABNORMAL LOW (ref 12.0–15.0)

## 2010-08-13 LAB — POCT I-STAT, CHEM 8
Calcium, Ion: 0.96 mmol/L — ABNORMAL LOW (ref 1.12–1.32)
Chloride: 105 mEq/L (ref 96–112)
Glucose, Bld: 106 mg/dL — ABNORMAL HIGH (ref 70–99)
HCT: 28 % — ABNORMAL LOW (ref 36.0–46.0)
Hemoglobin: 11.9 g/dL — ABNORMAL LOW (ref 12.0–15.0)
Hemoglobin: 9.5 g/dL — ABNORMAL LOW (ref 12.0–15.0)
Potassium: 4.1 mEq/L (ref 3.5–5.1)
Sodium: 138 mEq/L (ref 135–145)
Sodium: 139 mEq/L (ref 135–145)
TCO2: 27 mmol/L (ref 0–100)

## 2010-08-13 LAB — FOLATE: Folate: 19.3 ng/mL

## 2010-08-13 LAB — PREPARE FRESH FROZEN PLASMA

## 2010-08-13 LAB — IRON AND TIBC: TIBC: 281 ug/dL (ref 250–470)

## 2010-08-13 LAB — HEPARIN LEVEL (UNFRACTIONATED): Heparin Unfractionated: 1 IU/mL — ABNORMAL HIGH (ref 0.30–0.70)

## 2010-08-13 LAB — DIFFERENTIAL
Basophils Absolute: 0 10*3/uL (ref 0.0–0.1)
Basophils Absolute: 0.1 10*3/uL (ref 0.0–0.1)
Basophils Relative: 0 % (ref 0–1)
Eosinophils Absolute: 0 10*3/uL (ref 0.0–0.7)
Eosinophils Relative: 0 % (ref 0–5)
Lymphocytes Relative: 14 % (ref 12–46)
Lymphs Abs: 1.1 10*3/uL (ref 0.7–4.0)
Monocytes Absolute: 1 10*3/uL (ref 0.1–1.0)
Neutro Abs: 5.5 10*3/uL (ref 1.7–7.7)
Neutro Abs: 5.9 10*3/uL (ref 1.7–7.7)
Neutrophils Relative %: 75 % (ref 43–77)

## 2010-08-13 LAB — CARDIAC PANEL(CRET KIN+CKTOT+MB+TROPI)
CK, MB: 0.7 ng/mL (ref 0.3–4.0)
CK, MB: 0.9 ng/mL (ref 0.3–4.0)
CK, MB: 1.1 ng/mL (ref 0.3–4.0)
Relative Index: INVALID (ref 0.0–2.5)
Total CK: 51 U/L (ref 7–177)
Total CK: 55 U/L (ref 7–177)
Total CK: 65 U/L (ref 7–177)
Total CK: 67 U/L (ref 7–177)
Troponin I: <0.01 ng/mL (ref 0.00–0.06)

## 2010-08-13 LAB — POTASSIUM: Potassium: 4 mEq/L (ref 3.5–5.1)

## 2010-08-13 LAB — POCT CARDIAC MARKERS
CKMB, poc: <1 ng/mL — ABNORMAL LOW (ref 1.0–8.0)
Myoglobin, poc: 124 ng/mL (ref 12–200)
Troponin i, poc: <0.05 ng/mL (ref 0.00–0.09)

## 2010-08-13 LAB — FERRITIN: Ferritin: 34 ng/mL (ref 10–291)

## 2010-08-13 LAB — PHOSPHORUS: Phosphorus: 3.5 mg/dL (ref 2.3–4.6)

## 2010-08-13 LAB — URINE CULTURE

## 2010-08-13 LAB — AMYLASE: Amylase: 58 U/L (ref 0–105)

## 2010-08-13 LAB — BRAIN NATRIURETIC PEPTIDE: Pro B Natriuretic peptide (BNP): 63 pg/mL (ref 0.0–100.0)

## 2010-08-13 LAB — MAGNESIUM: Magnesium: 1.9 mg/dL (ref 1.5–2.5)

## 2010-08-13 LAB — TYPE AND SCREEN

## 2010-08-23 LAB — BASIC METABOLIC PANEL
CO2: 25 mEq/L (ref 19–32)
Calcium: 8.8 mg/dL (ref 8.4–10.5)
Creatinine, Ser: 1.18 mg/dL (ref 0.4–1.2)
GFR calc Af Amer: 52 mL/min — ABNORMAL LOW (ref >60)
GFR calc non Af Amer: 43 mL/min — ABNORMAL LOW (ref >60)
GFR calc non Af Amer: 51 mL/min — ABNORMAL LOW (ref >60)
Potassium: 4 mEq/L (ref 3.5–5.1)
Sodium: 138 mEq/L (ref 135–145)

## 2010-08-23 LAB — CULTURE, BLOOD (ROUTINE X 2)

## 2010-08-23 LAB — CBC
HCT: 28.6 % — ABNORMAL LOW (ref 36.0–46.0)
Hemoglobin: 9.2 g/dL — ABNORMAL LOW (ref 12.0–15.0)
MCHC: 31.6 g/dL (ref 30.0–36.0)
MCHC: 32.1 g/dL (ref 30.0–36.0)
MCV: 80.3 fL (ref 78.0–100.0)
Platelets: 240 10*3/uL (ref 150–400)
RBC: 3.56 MIL/uL — ABNORMAL LOW (ref 3.87–5.11)
RBC: 3.9 MIL/uL (ref 3.87–5.11)
RDW: 18.2 % — ABNORMAL HIGH (ref 11.5–15.5)
WBC: 7.5 10*3/uL (ref 4.0–10.5)
WBC: 9.5 10*3/uL (ref 4.0–10.5)

## 2010-08-23 LAB — DIFFERENTIAL
Basophils Absolute: 0 10*3/uL (ref 0.0–0.1)
Basophils Relative: 0 % (ref 0–1)
Monocytes Relative: 5 % (ref 3–12)
Neutro Abs: 7.2 10*3/uL (ref 1.7–7.7)
Neutrophils Relative %: 75 % (ref 43–77)

## 2010-08-23 LAB — POCT CARDIAC MARKERS
Myoglobin, poc: 195 ng/mL (ref 12–200)
Troponin i, poc: <0.05 ng/mL (ref 0.00–0.09)

## 2010-08-23 LAB — URINE CULTURE: Colony Count: >=100000

## 2010-08-23 LAB — IRON AND TIBC
Saturation Ratios: 5 % — ABNORMAL LOW (ref 20–55)
UIBC: 316 ug/dL

## 2010-08-23 LAB — HEPATIC FUNCTION PANEL
AST: 26 U/L (ref 0–37)
Albumin: 3.1 g/dL — ABNORMAL LOW (ref 3.5–5.2)
Total Bilirubin: 0.5 mg/dL (ref 0.3–1.2)

## 2010-08-23 LAB — FERRITIN: Ferritin: 24 ng/mL (ref 10–291)

## 2010-08-23 LAB — URINALYSIS, ROUTINE W REFLEX MICROSCOPIC
Bilirubin Urine: NEGATIVE
Ketones, ur: NEGATIVE mg/dL
Nitrite: POSITIVE — AB
Protein, ur: NEGATIVE mg/dL
Urobilinogen, UA: 0.2 mg/dL (ref 0.0–1.0)

## 2010-08-23 LAB — AMMONIA: Ammonia: 21 umol/L (ref 11–35)

## 2010-08-23 LAB — RETICULOCYTES: Retic Ct Pct: 1.5 % (ref 0.4–3.1)

## 2010-08-23 LAB — FOLATE: Folate: >20 ng/mL

## 2010-08-24 LAB — COMPREHENSIVE METABOLIC PANEL
ALT: 20 U/L (ref 0–35)
CO2: 24 mEq/L (ref 19–32)
Calcium: 8.5 mg/dL (ref 8.4–10.5)
Chloride: 102 mEq/L (ref 96–112)
Creatinine, Ser: 1.11 mg/dL (ref 0.4–1.2)
GFR calc non Af Amer: 46 mL/min — ABNORMAL LOW (ref >60)
Glucose, Bld: 106 mg/dL — ABNORMAL HIGH (ref 70–99)
Sodium: 135 mEq/L (ref 135–145)
Total Bilirubin: 0.4 mg/dL (ref 0.3–1.2)

## 2010-08-24 LAB — URINE MICROSCOPIC-ADD ON

## 2010-08-24 LAB — DIFFERENTIAL
Basophils Absolute: 0 10*3/uL (ref 0.0–0.1)
Eosinophils Absolute: 0 10*3/uL (ref 0.0–0.7)
Lymphs Abs: 1.2 10*3/uL (ref 0.7–4.0)
Neutrophils Relative %: 81 % — ABNORMAL HIGH (ref 43–77)

## 2010-08-24 LAB — POCT CARDIAC MARKERS
CKMB, poc: 1.7 ng/mL (ref 1.0–8.0)
Troponin i, poc: <0.05 ng/mL (ref 0.00–0.09)

## 2010-08-24 LAB — URINALYSIS, ROUTINE W REFLEX MICROSCOPIC
Bilirubin Urine: NEGATIVE
Leukocytes, UA: NEGATIVE
Nitrite: NEGATIVE
Specific Gravity, Urine: 1.009 (ref 1.005–1.030)
Urobilinogen, UA: 0.2 mg/dL (ref 0.0–1.0)
pH: 5 (ref 5.0–8.0)

## 2010-08-24 LAB — PROTIME-INR: Prothrombin Time: 13.1 seconds (ref 11.6–15.2)

## 2010-08-24 LAB — CBC
Hemoglobin: 10.5 g/dL — ABNORMAL LOW (ref 12.0–15.0)
MCHC: 31.4 g/dL (ref 30.0–36.0)
MCV: 81 fL (ref 78.0–100.0)
RBC: 4.14 MIL/uL (ref 3.87–5.11)

## 2010-08-24 LAB — BRAIN NATRIURETIC PEPTIDE: Pro B Natriuretic peptide (BNP): <30 pg/mL (ref 0.0–100.0)

## 2010-08-24 LAB — LIPASE, BLOOD: Lipase: 25 U/L (ref 11–59)

## 2010-09-06 LAB — DIFFERENTIAL
Lymphs Abs: 2 10*3/uL (ref 0.7–4.0)
Monocytes Absolute: 1.1 10*3/uL — ABNORMAL HIGH (ref 0.1–1.0)
Monocytes Relative: 10 % (ref 3–12)
Neutro Abs: 8.9 10*3/uL — ABNORMAL HIGH (ref 1.7–7.7)
Neutrophils Relative %: 73 % (ref 43–77)

## 2010-09-06 LAB — URINALYSIS, ROUTINE W REFLEX MICROSCOPIC
Glucose, UA: NEGATIVE mg/dL
Ketones, ur: NEGATIVE mg/dL
Nitrite: NEGATIVE
Specific Gravity, Urine: 1.015 (ref 1.005–1.030)
pH: 5.5 (ref 5.0–8.0)

## 2010-09-06 LAB — URINE CULTURE

## 2010-09-06 LAB — URINE MICROSCOPIC-ADD ON

## 2010-09-06 LAB — COMPREHENSIVE METABOLIC PANEL
Albumin: 3.4 g/dL — ABNORMAL LOW (ref 3.5–5.2)
BUN: 22 mg/dL (ref 6–23)
Calcium: 9.3 mg/dL (ref 8.4–10.5)
Glucose, Bld: 84 mg/dL (ref 70–99)
Potassium: 4 mEq/L (ref 3.5–5.1)
Sodium: 139 mEq/L (ref 135–145)
Total Protein: 6.7 g/dL (ref 6.0–8.3)

## 2010-09-06 LAB — CBC
Hemoglobin: 10.7 g/dL — ABNORMAL LOW (ref 12.0–15.0)
MCHC: 31.8 g/dL (ref 30.0–36.0)
Platelets: 289 10*3/uL (ref 150–400)
RDW: 15.6 % — ABNORMAL HIGH (ref 11.5–15.5)

## 2010-09-06 LAB — POCT CARDIAC MARKERS
CKMB, poc: <1 ng/mL — ABNORMAL LOW (ref 1.0–8.0)
Myoglobin, poc: 113 ng/mL (ref 12–200)
Myoglobin, poc: 90.9 ng/mL (ref 12–200)
Troponin i, poc: <0.05 ng/mL (ref 0.00–0.09)

## 2010-10-08 NOTE — Cardiovascular Report (Signed)
[place]. [facility]  Patient:    [name], [name]                       MRN: [id number] Proc. Date: 12/06/00 Adm. Date:  [id number] Attending:  Rollene Rotunda CC:         Lindell Spar. Chestine Spore, M.D.  Rollene Rotunda, M.D. [facility]  Cardiac Catheterization Laboratory   Cardiac Catheterization  PROCEDURES PERFORMED:  Left heart catheterization with coronary angiography and left ventriculography.  INDICATIONS:  The patient is an 77 year old woman, with a history of mild aortic stenosis.  She was admitted with recurrent and progressive chest pain and referred for cardiac catheterization.  DESCRIPTION OF PROCEDURE:  A 6 French sheath was placed in the right femoral artery.  Standard Judkins 6 French catheters were utilized.  Contrast was Omnipaque.  There were no complications.  RESULTS:  HEMODYNAMICS:  Left ventricular pressure 196/24.  Aortic pressure 180/84. There mean gradient across the aortic valve was 16 mmHg.  LEFT VENTRICULOGRAM:  Left ventriculogram reveals normal wall motion. Ejection fraction estimated at greater than 60%.  There is 1+ mild mitral regurgitation, some of which may be secondary to ventricular ectopy.  CORONARY ARTERIOGRAPHY:  (Codominant).  Left main:  Normal.  Left anterior descending:  The left anterior descending artery has a 25% stenosis in the proximal vessel, 25% in the mid vessel across the first diagonal and diffuse 20% stenosis in the distal vessel.  The LAD gives rise to a normal sized first diagonal which has a 30% stenosis and a small second and third diagonal branches.  The distal LAD is a very large vessel and curls the apex supplying a portion of the inferior wall.  Left circumflex:  The left circumflex is a codominant vessel.  It gives rise to a normal sized OM-1, small OM-2 and a very large branching OM-3 supplying the posterolateral wall.  There is a diffuse 30% stenosis in the mid circumflex.  OM-3 has a 50%  stenosis proximally and a 60% stenosis in the more lateral branch.  Right coronary artery:  The right coronary artery is a relatively small codominant vessel ending as a small posterior descending artery.  There is a 20% stenosis proximally.  IMPRESSIONS: 1. Normal left ventricular systolic function. 2. Mild aortic stenosis. 3. Moderate coronary artery disease which does not appear to be    hemodynamically significant.  Suspect her chest pain is noncardiac.  RECOMMENDATIONS:  Medical therapy. DD:  12/06/00 TD:  12/06/00 Job: [id number]

## 2010-10-08 NOTE — Discharge Summary (Signed)
[place]. [facility]  Patient:    [name], [name]                       MRN: [id number] Adm. Date:  [id number] Disc. Date: [id number] Attending:  Rollene Rotunda Dictator:   Brita Romp, P.A.C. CC:         Lindell Spar. Chestine Spore, M.D.  Rollene Rotunda, M.D. [facility]   Discharge Summary  DISCHARGE DIAGNOSES: 1. Coronary artery disease, status post cardiac catheterization. 2. Hypothyroidism. 3. Hypertension. 4. Anxiety. 5. Peptic ulcer disease.  HOSPITAL COURSE:  [name] is an 77 year old female who presented to the emergency room with complaint of chest pain radiating to her left arm and back.  She stated she had awaken on the morning of admission with the pain and it was similar to her prior myocardial infarction but was more severe.  It was accompanied by nausea, diaphoresis, and no increase in her chronic shortness of breath.  She initially contacted Dr. Burna Forts office and was referred to [facility] Emergency Room for further evaluation.  She was seen and admitted by Dr. Rollene Rotunda.  He felt that her chest pain was concerning for unstable angina.  He started her on IV nitroglycerin and Lovenox.  He also noted that she was on chronic Coumadin therapy for what she described as swelling in the legs.  His plan is to take the patient to the catheter lab once her INR is more than 1.6.  The next day, the patient was seen by Dr. Madolyn Frieze. [place].  He noted that she had some vague chest pain which was improved; however, she was not short of breath.  He noted the patient denied being on Coumadin and asked the patients daughter to bring in her medications from home.  Later that morning, the patient was seen by Dr. Chestine Spore.  She states she had some recurrent chest pain; however, it was "nothing like yesterday."  Dr. Chestine Spore noted that the patient has had similar chest pain while she lived in [place] with previous catheterization in September of  2000.  He stated that this did not show any significant coronary artery disease.  He noted that she has significant hypothyroidism related to an end-stage Graves disease.  Later that day, the patient had a transthoracic echocardiogram performed.  The left ventricular systolic function was noted to be normal with the ejection fraction being between 55% and 65%.  There were no left ventricular regional wall motion abnormalities.  The left mucoid wall thickness is moderately increased and there is also some mild focal basal septal hypertrophy.  The aortic valve thickness is moderately increased along with a mild reduction in the aortic valve leaflet excursion.  This was felt to be consistent with mild aortic valve stenosis.  The mean aortic valve gradient was 13 mmHg.  There was also mild to moderate mitral regurgitation and both the left and the right atrium were mildly dilated.  Later in the afternoon, the patient was taken to the catheter lab by Dr. Loraine Leriche Pulsipher.  He noted that there was some moderate coronary artery disease in both the left anterior descending coronary artery and left circumflex system; however, they did not appear to be hemodynamically significant.  He felt that her chest pain was probably noncardiac in origin.  The next morning, Dr. Chestine Spore again saw the patient.  She had had no further chest pain but still reported discomfort in her neck and shoulder areas.  He  also noted some mild tenderness in the left paracervical area.  He ordered an upper GI series to assess for any potential gastric causes or discomfort. There was noted to be some tiny mucosal erosions which was suspected to come from peptic ulcer disease.  Dr. Chestine Spore noted that the patients Helicobacter pylori titer was relatively normal and started the patient on Protonix 40 mg b.i.d.  The next morning, the patient was seen by Dr. Rollene Rotunda.  She still had some shoulder pain but no further chest pain  or shortness of breath.  As a result, she was stable for discharge.  DISCHARGE MEDICATIONS: 1. Enteric-coated aspirin 325 mg q.d. 2. Arthrotec 75 mg b.i.d. 3. Dynacirc 5 mg b.i.d. 4. Synthroid 0.15 mg q.d. 5. Toprol XL 100 mg q.d. 6. Monopril 10 mg q.d. 7. Premarin 0.625 mg q.d. 8. Celexa 20 mg q.d. 9. Imdur 30 mg or 1/2 of 60 mg tablet q.d. 10. Advair 2 puffs b.i.d. 11. Ativan 3.5 mg p.r.n. 12. Protonix 40 mg b.i.d.  DISCHARGE INSTRUCTIONS:  The patient was advised to avoid driving, heavy lifting, or tub baths for two to three days.  She was instructed to follow a low fat, low sodium diet.  She was advised to observe the catheterization site for any pain, bleeding, or swelling and to call the [place] office for any of these problems.  She is to contact Dr. Burna Forts office for follow-up within three weeks.  She is to follow up with Dr. Rollene Rotunda in the office on Friday, January 19, 2001 at 12:15.  LABORATORY DATA:  Sodium 141, potassium 3.7, chloride 102, CO2 30, BUN 9, creatinine 0.8, glucose 105.  ESR 20, TSH 1.803.  White count 4.7, hemoglobin 11.5, hematocrit 35.7, platelets 280,000.  Hemoglobin A1C of 5.1, total cholesterol 218, triglycerides 88, HDL 88, total cholesterol HDL ratio of 2.5, LDL of 112.  Chest x-ray revealed cardiomegaly with some increased markings. Electrocardiogram revealed normal sinus rhythm at approximately 70.  There was also noted to be a right bundle branch block.  PR interval is 0.202 and QRS 0.138, QTC 0.466 with an axis of 63. DD:  12/08/00 TD:  12/10/00 Job: [id number]

## 2010-11-05 ENCOUNTER — Other Ambulatory Visit: Payer: Self-pay | Admitting: Internal Medicine

## 2010-11-05 DIAGNOSIS — R29898 Other symptoms and signs involving the musculoskeletal system: Secondary | ICD-10-CM

## 2010-11-05 DIAGNOSIS — M5416 Radiculopathy, lumbar region: Secondary | ICD-10-CM

## 2010-11-10 ENCOUNTER — Ambulatory Visit (HOSPITAL_COMMUNITY)
Admission: RE | Admit: 2010-11-10 | Discharge: 2010-11-10 | Disposition: A | Discharge status: Home / Self Care | Payer: Medicare Other | Source: Ambulatory Visit | Attending: Internal Medicine | Admitting: Internal Medicine

## 2010-11-10 DIAGNOSIS — M6281 Muscle weakness (generalized): Secondary | ICD-10-CM | POA: Insufficient documentation

## 2010-11-10 DIAGNOSIS — R29898 Other symptoms and signs involving the musculoskeletal system: Secondary | ICD-10-CM

## 2010-11-10 DIAGNOSIS — M519 Unspecified thoracic, thoracolumbar and lumbosacral intervertebral disc disorder: Secondary | ICD-10-CM | POA: Insufficient documentation

## 2010-11-10 DIAGNOSIS — M48061 Spinal stenosis, lumbar region without neurogenic claudication: Secondary | ICD-10-CM | POA: Insufficient documentation

## 2010-11-10 DIAGNOSIS — IMO0002 Reserved for concepts with insufficient information to code with codable children: Secondary | ICD-10-CM | POA: Insufficient documentation

## 2010-11-10 DIAGNOSIS — M47817 Spondylosis without myelopathy or radiculopathy, lumbosacral region: Secondary | ICD-10-CM | POA: Insufficient documentation

## 2010-11-10 DIAGNOSIS — M5416 Radiculopathy, lumbar region: Secondary | ICD-10-CM

## 2010-11-10 DIAGNOSIS — Q619 Cystic kidney disease, unspecified: Secondary | ICD-10-CM | POA: Insufficient documentation

## 2010-11-10 DIAGNOSIS — M5126 Other intervertebral disc displacement, lumbar region: Secondary | ICD-10-CM | POA: Insufficient documentation

## 2010-11-10 LAB — RENAL FUNCTION PANEL
BUN: 16 mg/dL (ref 6–23)
CO2: 28 mEq/L (ref 19–32)
Calcium: 9.7 mg/dL (ref 8.4–10.5)
Creatinine, Ser: 1.08 mg/dL (ref 0.50–1.10)
GFR calc Af Amer: 58 mL/min — ABNORMAL LOW (ref >60)
Glucose, Bld: 92 mg/dL (ref 70–99)

## 2010-11-10 MED ORDER — GADOBENATE DIMEGLUMINE 529 MG/ML IV SOLN
17.0000 mL | Freq: Once | INTRAVENOUS | Status: AC
  Administered 2010-11-10: 17 mL via INTRAVENOUS

## 2011-02-07 ENCOUNTER — Telehealth: Payer: Self-pay | Admitting: Internal Medicine

## 2011-02-16 LAB — POCT URINALYSIS DIP (DEVICE)
Operator id: 282151
Protein, ur: NEGATIVE
Urobilinogen, UA: 0.2
pH: 5.5

## 2011-02-16 LAB — URINE CULTURE

## 2011-03-02 ENCOUNTER — Encounter: Payer: Self-pay | Admitting: Internal Medicine

## 2011-03-02 ENCOUNTER — Ambulatory Visit (INDEPENDENT_AMBULATORY_CARE_PROVIDER_SITE_OTHER): Payer: Medicare Other | Admitting: Internal Medicine

## 2011-03-02 DIAGNOSIS — I359 Nonrheumatic aortic valve disorder, unspecified: Secondary | ICD-10-CM

## 2011-03-02 DIAGNOSIS — Z01810 Encounter for preprocedural cardiovascular examination: Secondary | ICD-10-CM

## 2011-03-02 DIAGNOSIS — R55 Syncope and collapse: Secondary | ICD-10-CM

## 2011-03-02 NOTE — Assessment & Plan Note (Signed)
Thought to be vasomotor as unassociated with exertion but certainly could be related to her AS

## 2011-03-02 NOTE — Progress Notes (Signed)
HPI: [name] is a 77 y.o. female Seen in preop eval for what sounds like lacrimal duct surgery.  AS best as I can this is a nuisance but not the"worst thing in the world"  She has known severe AS; with mean gradient of 73, pk grad 120 and AVA of 0.6;  She has  A history of syncope and exercise intolerance felt 2/2 to diastolic heart failure.  She also has chronic edema.    She has chronic anemia 2/2 blood loss.   Current Outpatient Prescriptions  Medication Sig Dispense Refill  . acetaminophen (TYLENOL) 500 MG chewable tablet Chew 1,000 mg by mouth 2 (two) times daily.        Marland Kitchen aspirin 81 MG tablet Take 81 mg by mouth daily.        Marland Kitchen atorvastatin (LIPITOR) 20 MG tablet Take 20 mg by mouth daily.        . diphenoxylate-atropine (LOMOTIL) 2.5-0.025 MG per tablet Take 1 tablet by mouth as needed.        . donepezil (ARICEPT) 10 MG tablet Take 10 mg by mouth at bedtime.        . ergocalciferol (VITAMIN D2) 50000 UNITS capsule Take 50,000 Units by mouth once a week.        . esomeprazole (NEXIUM) 40 MG capsule Take 40 mg by mouth daily before breakfast.        . ferrous sulfate 325 (65 FE) MG tablet Take 325 mg by mouth daily with breakfast.        . furosemide (LASIX) 20 MG tablet Take 20 mg by mouth daily.       Marland Kitchen leflunomide (ARAVA) 20 MG tablet Take 20 mg by mouth daily.        Marland Kitchen levothyroxine (SYNTHROID, LEVOTHROID) 150 MCG tablet Take 150 mcg by mouth daily.        . Multiple Vitamins-Minerals (CENTRUM SILVER PO) Take by mouth.        Bertram Gala Glycol-Propyl Glycol (SYSTANE) 0.4-0.3 % SOLN Place 1 drop into both eyes daily as needed.        . Potassium Hydroxide 10 % SOLN 450 mLs by Does not apply route. 1 tablespoon every day       . predniSONE (DELTASONE) 5 MG tablet Take 7.5 mg by mouth daily.        . vitamin C (ASCORBIC ACID) 500 MG tablet Take 500 mg by mouth daily.        . zafirlukast (ACCOLATE) 20 MG tablet Take 20 mg by mouth 2 (two) times daily.          No Known  Allergies  Past Medical History  Diagnosis Date  . Chronic diastolic heart failure   . Rheumatoid arthritis   . Ulcer of esophagus with bleeding   . Essential hypertension, benign   . Benign neoplasm of colon   . Iron deficiency anemia secondary to blood loss (chronic)   . Syncope and collapse   . Pulmonary embolism   . Aortic stenosis   . Musculoskeletal chest pain   . Dementia   . Hypothyroidism   . HLD (hyperlipidemia)   . GI bleed     while on coumadin  . Diverticulosis     severe in the sigmond colon    Past Surgical History  Procedure Date  . Ivc filter 07/2009  . Knee arthroscopy   . Cataract extraction   . Polyp removal     sessile polyp inascending colon  Family History  Problem Relation Age of Onset  . Breast cancer Daughter   . Esophageal cancer Daughter   . Prostate cancer Other     nephew  . Diabetes Daughter   . Heart disease Other     family history    History   Social History  . Marital Status: Widowed    Spouse Name: N/A    Number of Children: N/A  . Years of Education: N/A   Occupational History  . Not on file.   Social History Main Topics  . Smoking status: Never Smoker   . Smokeless tobacco: Never Used  . Alcohol Use: No  . Drug Use: No  . Sexually Active: Not on file   Other Topics Concern  . Not on file   Social History Narrative  . No narrative on file    Fourteen point review of systems was negative except as noted in HPI and PMH   PHYSICAL EXAMINATION  Blood pressure 132/60, pulse 65, height 5' 3.5" (1.613 m), weight 179 lb (81.194 kg).   Well developed and nourished in no acute distress HENT normal Neck supple with JVP-8-9 Carotids parvus and tardus  Back without scoliosis or kyphosis Clear Regular rate and rhythm,harsh 3/6 systolic m wth singel s2 Abd-soft with active BS without hepatomegaly or midline pulsation Femoral pulses 2+ distal pulses intact No Clubbing cyanosis 2+edema Skin-warm and dry A  CN  3-12 normal  Grossly normal sensory and motor function Affect engaging . ecg sinus with RBBB LAE

## 2011-03-02 NOTE — Assessment & Plan Note (Signed)
Severe and symptomatic.  Not operable  Continue current meds

## 2011-03-02 NOTE — Assessment & Plan Note (Signed)
She has severe aortic stenosis;  Unless the situation is lifethreatening or the problem life disrupting i think the risks of elective surgery are excessive.  The measurements of her aortic valve are a 50 months old, and there was significant progression noted on the echo from dec 2011.

## 2011-03-16 ENCOUNTER — Ambulatory Visit: Payer: Medicare Other | Admitting: Internal Medicine

## 2011-03-22 ENCOUNTER — Emergency Department (HOSPITAL_COMMUNITY): Payer: Medicare Other

## 2011-03-22 ENCOUNTER — Inpatient Hospital Stay (HOSPITAL_COMMUNITY)
Admission: EM | Admit: 2011-03-22 | Discharge: 2011-03-24 | DRG: 292 | Disposition: A | Discharge status: Home Health | Payer: Medicare Other | Attending: Family Medicine | Admitting: Family Medicine

## 2011-03-22 DIAGNOSIS — N390 Urinary tract infection, site not specified: Secondary | ICD-10-CM | POA: Diagnosis present

## 2011-03-22 DIAGNOSIS — I359 Nonrheumatic aortic valve disorder, unspecified: Secondary | ICD-10-CM | POA: Diagnosis present

## 2011-03-22 DIAGNOSIS — R0789 Other chest pain: Secondary | ICD-10-CM | POA: Diagnosis present

## 2011-03-22 DIAGNOSIS — Z7982 Long term (current) use of aspirin: Secondary | ICD-10-CM

## 2011-03-22 DIAGNOSIS — I10 Essential (primary) hypertension: Secondary | ICD-10-CM | POA: Diagnosis present

## 2011-03-22 DIAGNOSIS — I517 Cardiomegaly: Secondary | ICD-10-CM | POA: Diagnosis present

## 2011-03-22 DIAGNOSIS — I5033 Acute on chronic diastolic (congestive) heart failure: Principal | ICD-10-CM | POA: Diagnosis present

## 2011-03-22 DIAGNOSIS — Z860100 Personal history of colon polyps, unspecified: Secondary | ICD-10-CM

## 2011-03-22 DIAGNOSIS — I509 Heart failure, unspecified: Secondary | ICD-10-CM | POA: Diagnosis present

## 2011-03-22 DIAGNOSIS — M24419 Recurrent dislocation, unspecified shoulder: Secondary | ICD-10-CM | POA: Diagnosis present

## 2011-03-22 DIAGNOSIS — M069 Rheumatoid arthritis, unspecified: Secondary | ICD-10-CM | POA: Diagnosis present

## 2011-03-22 DIAGNOSIS — E785 Hyperlipidemia, unspecified: Secondary | ICD-10-CM | POA: Diagnosis present

## 2011-03-22 DIAGNOSIS — Z79899 Other long term (current) drug therapy: Secondary | ICD-10-CM

## 2011-03-22 DIAGNOSIS — I4510 Unspecified right bundle-branch block: Secondary | ICD-10-CM | POA: Diagnosis present

## 2011-03-22 DIAGNOSIS — Z86711 Personal history of pulmonary embolism: Secondary | ICD-10-CM

## 2011-03-22 DIAGNOSIS — IMO0002 Reserved for concepts with insufficient information to code with codable children: Secondary | ICD-10-CM

## 2011-03-22 LAB — COMPREHENSIVE METABOLIC PANEL
ALT: 14 U/L (ref 0–35)
AST: 19 U/L (ref 0–37)
Albumin: 3.1 g/dL — ABNORMAL LOW (ref 3.5–5.2)
Alkaline Phosphatase: 61 U/L (ref 39–117)
Calcium: 9.8 mg/dL (ref 8.4–10.5)
GFR calc Af Amer: 53 mL/min — ABNORMAL LOW (ref >90)
Glucose, Bld: 111 mg/dL — ABNORMAL HIGH (ref 70–99)
Potassium: 4.1 mEq/L (ref 3.5–5.1)
Sodium: 141 mEq/L (ref 135–145)
Total Protein: 6.3 g/dL (ref 6.0–8.3)

## 2011-03-22 LAB — POCT I-STAT TROPONIN I: Troponin i, poc: 0.06 ng/mL (ref 0.00–0.08)

## 2011-03-22 LAB — URINALYSIS, ROUTINE W REFLEX MICROSCOPIC
Glucose, UA: NEGATIVE mg/dL
Hgb urine dipstick: NEGATIVE
Specific Gravity, Urine: 1.034 — ABNORMAL HIGH (ref 1.005–1.030)
pH: 5.5 (ref 5.0–8.0)

## 2011-03-22 LAB — PROTIME-INR: Prothrombin Time: 12.8 seconds (ref 11.6–15.2)

## 2011-03-22 LAB — DIFFERENTIAL
Basophils Absolute: 0 10*3/uL (ref 0.0–0.1)
Basophils Relative: 0 % (ref 0–1)
Eosinophils Absolute: 0 10*3/uL (ref 0.0–0.7)
Monocytes Absolute: 0.7 10*3/uL (ref 0.1–1.0)
Neutro Abs: 6.1 10*3/uL (ref 1.7–7.7)
Neutrophils Relative %: 72 % (ref 43–77)

## 2011-03-22 LAB — CBC
Hemoglobin: 10.3 g/dL — ABNORMAL LOW (ref 12.0–15.0)
MCHC: 31 g/dL (ref 30.0–36.0)
Platelets: 197 10*3/uL (ref 150–400)
RBC: 3.68 MIL/uL — ABNORMAL LOW (ref 3.87–5.11)

## 2011-03-22 LAB — URINE MICROSCOPIC-ADD ON

## 2011-03-22 LAB — CK TOTAL AND CKMB (NOT AT ARMC)
CK, MB: 2.5 ng/mL (ref 0.3–4.0)
Total CK: 49 U/L (ref 7–177)

## 2011-03-22 MED ORDER — IOHEXOL 300 MG/ML  SOLN
100.0000 mL | Freq: Once | INTRAMUSCULAR | Status: AC | PRN
  Administered 2011-03-22: 100 mL via INTRAVENOUS

## 2011-03-23 ENCOUNTER — Inpatient Hospital Stay (HOSPITAL_COMMUNITY): Payer: Medicare Other

## 2011-03-23 DIAGNOSIS — R0602 Shortness of breath: Secondary | ICD-10-CM

## 2011-03-23 LAB — BASIC METABOLIC PANEL
BUN: 18 mg/dL (ref 6–23)
Chloride: 103 mEq/L (ref 96–112)
GFR calc Af Amer: 57 mL/min — ABNORMAL LOW (ref >90)
Potassium: 3.5 mEq/L (ref 3.5–5.1)
Sodium: 142 mEq/L (ref 135–145)

## 2011-03-23 LAB — CBC
HCT: 33.3 % — ABNORMAL LOW (ref 36.0–46.0)
MCH: 27.8 pg (ref 26.0–34.0)
MCV: 89 fL (ref 78.0–100.0)
Platelets: 208 10*3/uL (ref 150–400)
RBC: 3.74 MIL/uL — ABNORMAL LOW (ref 3.87–5.11)
RDW: 15.5 % (ref 11.5–15.5)

## 2011-03-23 LAB — CARDIAC PANEL(CRET KIN+CKTOT+MB+TROPI)
Relative Index: INVALID (ref 0.0–2.5)
Relative Index: 1.9 (ref 0.0–2.5)
Troponin I: <0.3 ng/mL (ref <0.30)

## 2011-03-24 LAB — URINE CULTURE
Colony Count: >=100000
Culture  Setup Time: 201210310419

## 2011-03-24 MED ORDER — LEFLUNOMIDE 20 MG PO TABS
20.0000 mg | ORAL_TABLET | Freq: Every day | ORAL | Status: DC
  Filled 2011-03-24 (×4): qty 1

## 2011-03-24 NOTE — Progress Notes (Signed)
This was not our patient. Donnella Bi will call lab and make sure proper M.D. Gets report.

## 2011-03-25 NOTE — Discharge Summary (Signed)
[name], [name] NO.:  [id number]  MEDICAL RECORD NO.:  [id number]  LOCATION:  4733                         FACILITY:  [facility]  PHYSICIAN:  Brendia Sacks, MD    DATE OF BIRTH:  21-Sep-1917  DATE OF ADMISSION:  03/22/2011 DATE OF DISCHARGE:  03/24/2011                              DISCHARGE SUMMARY   PRIMARY CARE PHYSICIAN:  Dr. Margaretmary Bayley.  PRIMARY CARDIOLOGIST:  Dr. Alben Deeds.  PRIMARY CARDIOLOGIST:  Duke Salvia, MD, [facility]  CONDITION ON DISCHARGE:  Improved.  DISPOSITION:  Return home with daughter with home health physical therapy, which is new.  DISCHARGE DIAGNOSES: 1. Atypical chest pain, resolved. 2. Mild systolic congestive heart failure, acute, resolved. 3. Urinary tract infection. 4. Chronic left glenohumeral subluxation versus dislocation. 5. History of pulmonary embolism status post IVC filter placement.     Not a Coumadin candidate secondary to history of gastrointestinal     bleed. 6. Known critical aortic stenosis per Cardiology, not a surgical     candidate.  HPI:  This is a 77 year old woman admitted for chest pain and CHF exacerbation.  HOSPITAL COURSE:  [name] was admitted to medical floor and treated with IV diuresis.  She had rapid improvement in her heart failure symptoms and her chest pain resolved.  She is unreliable historian.  Her daughter reported that she had chest pain for approximately 2 days. This seemed to be atypical in nature and cardiac enzymes were negative. EKG was reassuring with a chronic right bundle branch block, and telemetry has shown sinus rhythm with no acute changes.  No further evaluation is recommended.  She does have a history of nonobstructive coronary disease.  Given her advanced age, and atypical presentation with reassuring findings would recommend no further evaluation.  Her mild diastolic heart failure was treated with IV diuresis.  She has been transitioned back to her Lasix  40 mg daily which Dr. Margaretmary Bayley had recently increased to 40 mg daily.  She has had a change in her weight from 80-76 kg, appears to be quite stable at this point.  To continue on beta-blocker therapy.  We will defer further diuretic titration to Dr. Chestine Spore.  Her urinary tract infection, this was treated with a total 3 day course of antibiotics.  Left glenohumeral subluxation, chest x-ray did show incidental finding of glenohumeral chronic dislocation.  This is favored to be chronic in nature.  This first seen on chest x-ray on March 22, 2011, this admission.  The patient's daughter reports that she has had pain in the shoulder for some time now and it has been worse over the last 6 months or so.  She does not use the shoulder very much at all.  The patient used to be seen by Dr. Jimmy Footman Rheumatology.  I have discussed the case with his partner Dr. Dierdre Forth who does report that Dr. Jimmy Footman had noted in the patient's chart superior subluxation of the left humeral head in January 2011.  She has also underwent injections for this area. I have briefly discussed the case with Dr. Sable Feil partner who had evaluated her shoulder pain approximately 3 years ago.  Dr. Patricia Pesa suggested that this  is chronic in nature.  She is not a surgical candidate and main treatment is to continue supportive care and injections as needed.  I discussed all of the above with the patient's daughter by phone.  I updated her on all the testing and results and recommendations.  The patient is stable for home today.  CONSULTATIONS:  None.  PROCEDURES:  None.  IMAGING: 1. Chest x-ray on October 30:  Cardiac enlargement.  Pulmonary     fibrosis.  No acute consolidation. 2. CT angiogram of the chest on October 30:  Negative for pulmonary     emboli.  Mild bibasilar atelectasis. 3. Chest x-ray on October 31:  Left glenohumeral dislocation     unchanged.  MICROBIOLOGY:  Urine culture on October 30:  Escherichia coli greater than 100,000 colonies per mL, pansensitive.  ANCILLARY STUDIES: 1. EKG showed sinus rhythm, bilateral atrial enlargement, chronic     right bundle branch block.  No acute changes were seen. 2. Bilateral lower extremity venous Dopplers were negative for DVT or     superficial thrombosis. 3. CBC notable for hemoglobin of 10.4 which is stable. 4. Basic metabolic panel, unremarkable with normal BUN and creatinine. 5. BNP stable. 6. Cardiac enzymes negative.  DISCHARGE INSTRUCTIONS:  The patient will be discharged home with home health physical therapy.  Diet is heart healthy diet.  Increase activity slowly . Walk with assistance, use a walker.  Follow up with Dr. Margaretmary Bayley in 1 week, Dr. Alben Deeds, call for appointment as needed for your left shoulder pain and follow up with Dr. Graciela Husbands as needed.  DISCHARGE MEDICATIONS:  New: 1. Cipro 250 mg p.o. b.i.d. to complete a 3 day course. 2. Lasix continues at dosage Dr. Chestine Spore had recently increased to 40 mg     p.o. daily.  Continue to titrate this upward down as needed.  Resume the following medications, 1. Accolate 20 mg p.o. b.i.d. 2. Acetaminophen 325 mg 1-2 tablets by mouth every 6 hours as needed     for pain. 3. Amlodipine 5 mg p.o. daily. 4. Aricept 10 mg p.o. nightly. 5. Diphenoxylate/atropine 2.5/0.25 mg 1-2 tablets daily as needed for     diarrhea. 6. Folic acid 1 mg p.o. daily. 7. Iron sulfate 325 mg p.o. daily. 8. Iron 65 mg p.o. daily. 9. Labetalol 200 mg one half tablet p.o. b.i.d. 10.Leflunomide 20 mg p.o. daily. 11.Lipitor 20 mg p.o. nightly. 12.Nexium 40 mg p.o. daily. 13.Potassium chloride 20 mEq p.o. every other day. 14.Prednisone 5 mg one and a half tablets p.o. daily. 15.Synthroid 150 mcg p.o. daily. 16.Tramadol/acetaminophen 37.5/325 one tablet every 6 hours as needed     for pain. 17.Vitamin C 500 mg p.o. daily. 18.Vitamin D2 50,000 units p.o. weekly.  Time coordinating  discharge, discussing the case with the primary care physician as well as rheumatologist and orthopedist is 1 hour.     Brendia Sacks, MD     DG/MEDQ  D:  03/24/2011  T:  03/24/2011  Job:  [id number]  cc:   Margaretmary Bayley, M.D. Alben Deeds, MD Duke Salvia, MD, [facility]  Electronically Signed by Brendia Sacks  on 03/25/2011 06:38:17 PM

## 2011-03-30 NOTE — H&P (Signed)
[name], [name] NO.:  [id number]  MEDICAL RECORD NO.:  [id number]  LOCATION:  4733                         FACILITY:  [facility]  PHYSICIAN:  Tarry Kos, MD       DATE OF BIRTH:  [birth year]  DATE OF ADMISSION:  03/22/2011 DATE OF DISCHARGE:                             HISTORY & PHYSICAL   CHIEF COMPLAINT:  Shortness of breath and chest pain.  HISTORY OF PRESENT ILLNESS:  Ms. [name] is a very pleasant 77 year old female lives at home with her daughter and a caregiver, has been having several days of worsening lower extremity edema and shortness of breath and started experiencing some substernal chest pain earlier today that has now resolved with no radiation, nausea or vomiting.  Her daughter says that she had some lower extremity swelling, left greater than right.  She saw her primary care physician recently and her Lasix was increased from 20 to 40 mg daily, and the lower extremity edema hasgotten much better.  However, she has also been having pain in the bilateral legs.  She is having pressure-like pain in her shins.  Her daughter brought her and indicated she was having chest pain.  She has known severe aortic stenosis for which she is being medically managed and she is not a surgical candidate.  She has history of multiple blood clots in the past.  She is status post IVC filter placement because she has been deemed not to be a Coumadin candidate.  She has been experiencing some PND several weeks now and progressively worsening weakness.  Her daughter states she has not had any history of heart failure in the past.  REVIEW OF SYSTEMS:  Otherwise negative.  PAST MEDICAL HISTORY: 1. Mild dementia. 2. Severe aortic stenosis, last echo over a year ago. 3. History of multiple blood clots including PE in the past, status     post IVC filter. 4. History of GI bleed. 5. Hypertension. 6. History of rheumatoid arthritis. 7. Nonobstructive coronary artery  disease. 8. It is documented that she has gotten chronic diastolic heart     failure. 9. Hyperlipidemia. 10.History of recurrent chest pain. 11.History of distal esophageal Mallory-Weiss tear and colonic polyps     and esophageal ulcer.  SOCIAL HISTORY:  She does not smoke.  No alcohol.  No IV drug abuse. She lives with her daughter and a caregiver.  She has gotten 6 children. She is a full code.  MEDICATION:  Acetaminophen, Nexium, atorvastatin, labetalol, Lasix, prednisone, leflunomide, Accolate, donepezil, vitamin D2, Synthroid, potassium chloride, vitamin C, iron, folic acid.  PHYSICAL EXAMINATION:  VITAL SIGNS:  Temperature 98, blood pressure 171/48, pulse 73, respirations 19, 99% O2 sats on 2 L nasal cannula. GENERAL:  She is alert and oriented to person, place, and time, in no apparent distress. HEENT:  Extraocular muscles intact.  Pupils equal and reactive to light. Oropharynx clear.  Mucous membranes moist. NECK:  She has gotten some mild JVD.  Mucous membranes moist. COR:  Regular rate and rhythm with blowing grade 4/6 systolic ejection murmur consistent with aortic stenosis. LUNGS:  Clear to auscultation with some mild crackles at bases bilaterally. ABDOMEN:  Soft, nontender, nondistended.  Positive bowel sounds.  No hepatosplenomegaly. EXTREMITIES:  No clubbing, cyanosis, or edema. PSYCH:  Normal mood and affect. NEURO:  No focal neurologic deficits.  LABORATORY DATA:  Her BNP is elevated at 1800.  Lipase normal.  LFTs normal.  BUN and creatinine normal.  Her white count is normal. Hemoglobin 10.3.  Coags normal.  Cardiac enzymes negative.  We did not find that a chest x-ray has been done.  Old EKG:  Right bundle-branch block.  ASSESSMENT AND PLAN:  This is a 77 year old female with chest pain and congestive heart failure exacerbation. 1. Acute congestive heart failure exacerbation in the setting of     severe aortic stenosis.  I am going to rule out with serial  cardiac     enzymes.  I placed her on aspirin.  I placed her on Lasix 40 mg IV     q.12 for 2 doses and Lasix 40 mg daily IV.  This will need to be     reassessed in the morning.  I will also repeat a 2-D echo and     conservatively manage her medically.  Obtain daily weights and     accurate I's  and O's, place a Foley catheter.  We will check a     chest x-ray if one had not yet been  done in ED, but I do not see     it being pulled up on eChart, to make sure she does not have any     underlying infiltrate. 2. Severe aortic stenosis as above. 3. The patient is full code. 4. Clarify all medications. 5. Further recommendations pending overall hospital course. 6. I am also going to obtain a bilateral lower extremity ultrasound to     rule out for DVT.          ______________________________ Tarry Kos, MD     RD/MEDQ  D:  03/22/2011  T:  03/23/2011  Job:  [id number]  Electronically Signed by Tarry Kos MD on 03/30/2011 09:22:14 PM

## 2011-04-20 ENCOUNTER — Encounter: Payer: Self-pay | Admitting: Physician Assistant

## 2011-04-20 ENCOUNTER — Ambulatory Visit (INDEPENDENT_AMBULATORY_CARE_PROVIDER_SITE_OTHER): Payer: Medicare Other | Admitting: Physician Assistant

## 2011-04-20 VITALS — BP 122/68 | HR 72 | Ht 62.0 in | Wt 179.1 lb

## 2011-04-20 DIAGNOSIS — M069 Rheumatoid arthritis, unspecified: Secondary | ICD-10-CM

## 2011-04-20 DIAGNOSIS — I10 Essential (primary) hypertension: Secondary | ICD-10-CM

## 2011-04-20 DIAGNOSIS — I359 Nonrheumatic aortic valve disorder, unspecified: Secondary | ICD-10-CM

## 2011-04-20 DIAGNOSIS — I5032 Chronic diastolic (congestive) heart failure: Secondary | ICD-10-CM

## 2011-04-20 DIAGNOSIS — I509 Heart failure, unspecified: Secondary | ICD-10-CM

## 2011-04-20 LAB — BASIC METABOLIC PANEL
CO2: 22 mEq/L (ref 19–32)
Chloride: 110 mEq/L (ref 96–112)
Potassium: 4.6 mEq/L (ref 3.5–5.1)
Sodium: 142 mEq/L (ref 135–145)

## 2011-04-20 NOTE — Assessment & Plan Note (Signed)
I am concerned about letting her BP drift too low with critical AS.  With her edema, decrease amlodipine to 2.5 mg QD.

## 2011-04-20 NOTE — Patient Instructions (Addendum)
Weigh daily and call if:  Weight up 2-3 lbs in one day, increased swelling or increased dyspnea.   Your physician recommends that you return for lab work in: TODAY BMET 401.1 HTN  Your physician has recommended you make the following change in your medication: CALL us WHEN YOU GET HOME TO VERIFY IF YOU ARE TAKING AMLODIPINE [phone] Jewett Mcgann, PA-C OR CAROL FIATO, CMA; IF YOU ARE TAKING AMLODIPINE THEN WE WANT YOU TO DECREASE IT TO 2.5 MG DAILY.  Your physician recommends that you schedule a follow-up appointment in: 4-6 WEEKS WITH DR, Graciela Husbands PER [address] PA-C

## 2011-04-20 NOTE — Progress Notes (Signed)
History of Present Illness: PCP:  Dr. Chestine Spore Primary Cardiologist:  Dr. Sherryl Manges  Rheumatologist: Dr. Lin Landsman [name] is a 77 y.o. female who presents for post hospital follow up.  She has a h/o severe, inoperable aortic stenosis, diastolic CHF, non-obstructive CAD, pulmonary emboli, s/p IVC filter, GI bleeding (now off coumadin), HTN, HLP, hypothyroidism, anemia, orthostatic intolerance and rheumatoid arthritis.  LHC 11/2000: EF 60%, pLAD 25%, dLAD 20%, D1 30%, mCFX 30%, pOM2 50% and lateral branch 60%, pRCA 20%.  Last echo 4/11: mod LVH, mild focal basal septal hypertrophy, EF 55-60%, grade 1 diast dysfxn, critical AS, mild AI, AVA 0.6 cm2, mean gradient across AV 73 mmHg, mild MR, mild LAE, mild elevated PASP.  She was admitted 10/30-11/1 with a/c diastolic CHF.  She was treated with IV diuresis.  She c/o chest pain and ruled out for MI.  Labs: Hgb 10.4, K 3.5, Creatinine 0.98, ALT 14, CE's neg x 3, BNP 1909.  Chest CTA negative for pulmonary emboli.    She was noted to have a chronic left glenohumeral subluxation vs dislocation and she was asked to follow up with Dr. Dierdre Forth.  She is having significant pain and limitation from her left arm.  She is requiring more and more help because of her limitation.  She denies chest pain.  She does not weight herself.  Her LE edema is baseline.  She denies orthopnea, PND or edema.  No syncope.  She sometimes feels lightheaded.  No cough or wheezing.    Past Medical History  Diagnosis Date  . Chronic diastolic heart failure   . Rheumatoid arthritis   . Ulcer of esophagus with bleeding   . Essential hypertension, benign   . Benign neoplasm of colon   . Iron deficiency anemia secondary to blood loss (chronic)   . Syncope and collapse   . Pulmonary embolism     IVC filter  . Aortic stenosis     echo 4/11: mod LVH, mild focal basal septal hypertrophy, EF 55-60%, grade 1 diast dysfxn, critical AS, mild AI, AVA 0.6 cm2, mean gradient across AV  73 mmHg, mild MR, mild LAE, mild elevated PASP.  Marland Kitchen Musculoskeletal chest pain   . Dementia   . Hypothyroidism   . HLD (hyperlipidemia)   . GI bleed     while on coumadin  . Diverticulosis     severe in the sigmond colon  . CAD (coronary artery disease)     LHC 11/2000: EF 60%, pLAD 25%, dLAD 20%, D1 30%, mCFX 30%, pOM2 50% and lateral branch 60%, pRCA 20%.    Current Outpatient Prescriptions  Medication Sig Dispense Refill  . acetaminophen (TYLENOL) 500 MG chewable tablet Chew 500-1,000 mg by mouth every 6 (six) hours as needed. For pain      . amLODipine (NORVASC) 5 MG tablet Take 5 mg by mouth daily.        Marland Kitchen atorvastatin (LIPITOR) 20 MG tablet Take 20 mg by mouth at bedtime.       . diphenoxylate-atropine (LOMOTIL) 2.5-0.025 MG per tablet Take 1-2 tablets by mouth daily as needed. For diarrhea      . donepezil (ARICEPT) 10 MG tablet Take 10 mg by mouth at bedtime.        . ergocalciferol (VITAMIN D2) 50000 UNITS capsule Take 50,000 Units by mouth once a week.        . esomeprazole (NEXIUM) 40 MG capsule Take 40 mg by mouth daily before breakfast.        .  FeFum-FePoly-FA-B Cmp-C-Biot (INTEGRA PLUS) CAPS Take by mouth 3 (three) times daily.        . furosemide (LASIX) 20 MG tablet Take 20 mg by mouth daily.       Marland Kitchen labetalol (NORMODYNE) 200 MG tablet Take 100 mg by mouth 2 (two) times daily.        Marland Kitchen leflunomide (ARAVA) 20 MG tablet Take 20 mg by mouth daily.        Marland Kitchen levothyroxine (SYNTHROID, LEVOTHROID) 150 MCG tablet Take 150 mcg by mouth daily.        . NON FORMULARY Take by mouth 1 day or 1 dose. Pottasium liquid  1 tablspoon daily       . predniSONE (DELTASONE) 5 MG tablet Take 7.5 mg by mouth daily.       . traMADol-acetaminophen (ULTRACET) 37.5-325 MG per tablet Take 1 tablet by mouth every 6 (six) hours as needed. For pain       . vitamin C (ASCORBIC ACID) 500 MG tablet Take 500 mg by mouth daily.        . zafirlukast (ACCOLATE) 20 MG tablet Take 20 mg by mouth 2 (two) times  daily.          Allergies: Allergies  Allergen Reactions  . Vioxx (Rofecoxib) Other (See Comments)    Unknown     History  Substance Use Topics  . Smoking status: Never Smoker   . Smokeless tobacco: Never Used  . Alcohol Use: No     ROS:  Please see the history of present illness.    All other systems reviewed and negative.   Vital Signs: BP 122/68  Ht 5\' 2"  (1.575 m)  Wt 179 lb 1.9 oz (81.248 kg)  BMI 32.76 kg/m2  PHYSICAL EXAM: Well nourished, well developed, in no acute distress HEENT: normal Neck: I cannot appreciate JVD at 90 degrees Cardiac:  normal S1, no S2; RRR; 3/6 harsh systolic murmur at RUSB and LSB Lungs:  clear to auscultation bilaterally, no wheezing, rhonchi or rales Abd: soft, nontender Ext: trace bilateral ankle edema Skin: warm and dry Neuro:  CNs 2-12 intact, no focal abnormalities noted Psych: normal affect  EKG:   NSR, HR 72,  Normal axis, RBBB, NSSTTW changes, no change from prior  ASSESSMENT AND PLAN:

## 2011-04-20 NOTE — Assessment & Plan Note (Signed)
Check bmet today.  We discussed weighing daily.  In the setting of her critical AS, I would prefer to increase diuresis only if her weight increases with increasing symptoms of volume overload.  She appears stable today and will continue her current therapy.

## 2011-04-20 NOTE — Assessment & Plan Note (Signed)
I have advised her to follow up with her rheumatologist to see if there is anything conservative that can be done to help with her shoulder pain.

## 2011-04-20 NOTE — Assessment & Plan Note (Signed)
We reviewed the severity of her AS and that she is not a surgical candidate.  She notes chest pain but I believe this is from her shoulder.  The risk of general anesthesia or heavy sedation is too great and this should be avoided for her shoulder.  Follow up with Dr. Sherryl Manges in 4-6 weeks.

## 2011-05-10 ENCOUNTER — Telehealth: Payer: Self-pay | Admitting: Internal Medicine

## 2011-05-10 NOTE — Telephone Encounter (Signed)
Pt has not been on amlodeipine 5mg  since 2010.

## 2011-05-10 NOTE — Telephone Encounter (Signed)
Ok I have removed from list.  Tereso Newcomer, PA-C  9:06 AM 05/10/2011

## 2011-06-03 ENCOUNTER — Encounter: Payer: Self-pay | Admitting: Internal Medicine

## 2011-06-03 ENCOUNTER — Ambulatory Visit (INDEPENDENT_AMBULATORY_CARE_PROVIDER_SITE_OTHER): Payer: Medicare Other | Admitting: Internal Medicine

## 2011-06-03 VITALS — BP 124/49 | HR 63 | Ht 63.5 in | Wt 178.0 lb

## 2011-06-03 DIAGNOSIS — I2699 Other pulmonary embolism without acute cor pulmonale: Secondary | ICD-10-CM

## 2011-06-03 DIAGNOSIS — I359 Nonrheumatic aortic valve disorder, unspecified: Secondary | ICD-10-CM

## 2011-06-03 DIAGNOSIS — I5032 Chronic diastolic (congestive) heart failure: Secondary | ICD-10-CM

## 2011-06-03 DIAGNOSIS — R55 Syncope and collapse: Secondary | ICD-10-CM

## 2011-06-03 DIAGNOSIS — R400 Somnolence: Secondary | ICD-10-CM | POA: Insufficient documentation

## 2011-06-03 DIAGNOSIS — I509 Heart failure, unspecified: Secondary | ICD-10-CM

## 2011-06-03 DIAGNOSIS — R404 Transient alteration of awareness: Secondary | ICD-10-CM

## 2011-06-03 NOTE — Patient Instructions (Signed)
Your physician has recommended you make the following change in your medication:  1) Stop ultram.  Your physician wants you to follow-up in: 4 months with Dr. Graciela Husbands. You will receive a reminder letter in the mail two months in advance. If you don't receive a letter, please call our office to schedule the follow-up appointment.

## 2011-06-03 NOTE — Assessment & Plan Note (Signed)
This is new and is temporally related to the initiation of tramadol. It is one of the listed side effects. I suggested that they stop it and followup with Dr. Ave Filter. I also note that one of the interactions of concern is with acetaminophen which she has been taking independently of the tramadol-acetaminophen combination

## 2011-06-03 NOTE — Assessment & Plan Note (Signed)
She is progressive aortic stenosis. I have reviewed with her daughter that there could be sudden cardiac catastrophe. Currently she is a resuscitate; I've asked the family to consider whether this is concordant with their wishes and to review this with Dr. Margaretmary Bayley

## 2011-06-03 NOTE — Assessment & Plan Note (Signed)
Status post filter not on anticoagulation secondary to GI bleeding

## 2011-06-03 NOTE — Assessment & Plan Note (Signed)
Euvolemic. We'll continue her on her current medications 

## 2011-06-03 NOTE — Progress Notes (Signed)
HPI  [name] is a 78 y.o. female Seen in followup for A/C diasystolic CHF in the context of inoperable aortic stenosis; hx pulmonary emboli, s/p IVC filter, GI bleeding (now off coumadin)   LHC 11/2000: EF 60%, pLAD 25%, dLAD 20%, D1 30%, mCFX 30%, pOM2 50% and lateral branch 60%, pRCA 20%.   Last echo 4/11: mod LVH, mild focal basal septal hypertrophy, EF 55-60%, grade 1 diast dysfxn, critical AS, mild AI, AVA 0.6 cm2, mean gradient across AV 73 mmHg, mild MR, mild LAE, mild elevated PASP.      She has had a significant shoulder problems with now chronic subluxation. She is not operative candidate. She was started on tramadol earlier this week and over the last 24 hours has been extremely sleepy although arousable. I note also that she is on tramadol acetaminophen combination as well as taking acetaminophen   Past Medical History  Diagnosis Date  . Chronic diastolic heart failure   . Rheumatoid arthritis   . Ulcer of esophagus with bleeding   . Essential hypertension, benign   . Benign neoplasm of colon   . Iron deficiency anemia secondary to blood loss (chronic)   . Syncope and collapse   . Pulmonary embolism     IVC filter  . Aortic stenosis     echo 4/11: mod LVH, mild focal basal septal hypertrophy, EF 55-60%, grade 1 diast dysfxn, critical AS, mild AI, AVA 0.6 cm2, mean gradient across AV 73 mmHg, mild MR, mild LAE, mild elevated PASP.  Marland Kitchen Musculoskeletal chest pain   . Dementia   . Hypothyroidism   . HLD (hyperlipidemia)   . GI bleed     while on coumadin  . Diverticulosis     severe in the sigmond colon  . CAD (coronary artery disease)     LHC 11/2000: EF 60%, pLAD 25%, dLAD 20%, D1 30%, mCFX 30%, pOM2 50% and lateral branch 60%, pRCA 20%.    Past Surgical History  Procedure Date  . Ivc filter 07/2009  . Knee arthroscopy   . Cataract extraction   . Polyp removal     sessile polyp inascending colon    Current Outpatient Prescriptions  Medication Sig  Dispense Refill  . acetaminophen (TYLENOL) 500 MG chewable tablet Chew 500-1,000 mg by mouth every 6 (six) hours as needed. For pain      . aspirin 81 MG tablet Take 160 mg by mouth daily.      Marland Kitchen atorvastatin (LIPITOR) 20 MG tablet Take 20 mg by mouth at bedtime.       . donepezil (ARICEPT) 10 MG tablet Take 10 mg by mouth at bedtime.        . ergocalciferol (VITAMIN D2) 50000 UNITS capsule Take 50,000 Units by mouth once a week.        . esomeprazole (NEXIUM) 40 MG capsule Take 40 mg by mouth daily before breakfast.        . FeFum-FePoly-FA-B Cmp-C-Biot (INTEGRA PLUS) CAPS Take by mouth 3 (three) times daily.        . furosemide (LASIX) 20 MG tablet Take 20 mg by mouth daily.       Marland Kitchen labetalol (NORMODYNE) 200 MG tablet Take 100 mg by mouth 2 (two) times daily.       Marland Kitchen leflunomide (ARAVA) 20 MG tablet Take 20 mg by mouth daily.        Marland Kitchen levothyroxine (SYNTHROID, LEVOTHROID) 150 MCG tablet Take 150 mcg by mouth daily.        Marland Kitchen  predniSONE (DELTASONE) 5 MG tablet Take 7.5 mg by mouth daily.       . traMADol-acetaminophen (ULTRACET) 37.5-325 MG per tablet Take 1 tablet by mouth every 6 (six) hours as needed. For pain       . vitamin C (ASCORBIC ACID) 500 MG tablet Take 500 mg by mouth daily.        . zafirlukast (ACCOLATE) 20 MG tablet Take 20 mg by mouth 2 (two) times daily.        . diphenoxylate-atropine (LOMOTIL) 2.5-0.025 MG per tablet Take 1-2 tablets by mouth daily as needed. For diarrhea      . NON FORMULARY Take by mouth 1 day or 1 dose. Pottasium liquid  1 tablspoon daily         Allergies  Allergen Reactions  . Vioxx (Rofecoxib) Other (See Comments)    Unknown     Review of Systems negative except from HPI and PMH  Physical Exam BP 124/49  Pulse 63  Ht 5' 3.5" (1.613 m)  Wt 178 lb (80.74 kg)  BMI 31.04 kg/m2 Well developed and well nourished in no acute distress HENT normal E scleral and icterus clear Neck Supple JVP flat; carotids delayed Clear to ausculation Regular  rate and rhythm, 3-4/6 systolic ejection murmur Soft with active bowel sounds No clubbing cyanosis Trace Edema Alert and oriented, grossly normal motor and sensory function Skin Warm and Dry  ECG demonstrates sinus rhythm at 63 intervals 0.17/0.13/0.48 Right bundle branch block  Assessment and  Plan

## 2011-10-10 ENCOUNTER — Ambulatory Visit: Payer: Medicare Other | Admitting: Internal Medicine

## 2011-10-20 ENCOUNTER — Encounter: Payer: Self-pay | Admitting: Internal Medicine

## 2011-10-20 ENCOUNTER — Ambulatory Visit (INDEPENDENT_AMBULATORY_CARE_PROVIDER_SITE_OTHER): Payer: Medicare Other | Admitting: Internal Medicine

## 2011-10-20 VITALS — BP 150/60 | HR 74 | Ht 63.5 in | Wt 169.4 lb

## 2011-10-20 DIAGNOSIS — I2699 Other pulmonary embolism without acute cor pulmonale: Secondary | ICD-10-CM

## 2011-10-20 DIAGNOSIS — I10 Essential (primary) hypertension: Secondary | ICD-10-CM

## 2011-10-20 DIAGNOSIS — I5032 Chronic diastolic (congestive) heart failure: Secondary | ICD-10-CM

## 2011-10-20 DIAGNOSIS — I359 Nonrheumatic aortic valve disorder, unspecified: Secondary | ICD-10-CM

## 2011-10-20 MED ORDER — FUROSEMIDE 40 MG PO TABS: 40.0000 mg | ORAL_TABLET | Freq: Every day | ORAL | Status: DC

## 2011-10-20 NOTE — Assessment & Plan Note (Addendum)
As above  I have also reviewed with her and her daughter be potentially life-threatening nature and a catastrophic potential of aortic stenosis to this degree

## 2011-10-20 NOTE — Assessment & Plan Note (Signed)
She has unilateral edema and an IVC filter. I wonder whether there is clot in that leg. Given the fact that she is not an anticoagulation candidate, duplex evaluation I don't think this is helpful. We'll try low-dose diuretics

## 2011-10-20 NOTE — Progress Notes (Signed)
HPI  [name] is a 78 y.o. female Seen in followup for acute on chronic diastolic heart failure in the context of inoperable aortic stenosis. She also has a history of pulmonary emboli status post IVC filter. Because of GI bleeding she is not on anticoagulation. She has by catheterization in 2002 modest coronary disease  Echo 2011 and aortic valve area of 0.6 She's had progressive shortness of breath. There is also been recently increasing problems with peripheral edema left greater than right. She has a history of pulmonary embolism and presumably DVT status post IVC filter. She is not on anticoagulation because of chronic GI blood loss  Recent blood work was drawn a couple of weeks ago with Dr. Chestine Spore  Past Medical History  Diagnosis Date  . Chronic diastolic heart failure   . Rheumatoid arthritis   . Ulcer of esophagus with bleeding   . Essential hypertension, benign   . Benign neoplasm of colon   . Iron deficiency anemia secondary to blood loss (chronic)   . Syncope and collapse   . Pulmonary embolism     IVC filter  . Aortic stenosis     echo 4/11: mod LVH, mild focal basal septal hypertrophy, EF 55-60%, grade 1 diast dysfxn, critical AS, mild AI, AVA 0.6 cm2, mean gradient across AV 73 mmHg, mild MR, mild LAE, mild elevated PASP.  Marland Kitchen Musculoskeletal chest pain   . Dementia   . Hypothyroidism   . HLD (hyperlipidemia)   . GI bleed     while on coumadin  . Diverticulosis     severe in the sigmond colon  . CAD (coronary artery disease)     LHC 11/2000: EF 60%, pLAD 25%, dLAD 20%, D1 30%, mCFX 30%, pOM2 50% and lateral branch 60%, pRCA 20%.    Past Surgical History  Procedure Date  . Ivc filter 07/2009  . Knee arthroscopy   . Cataract extraction   . Polyp removal     sessile polyp inascending colon    Current Outpatient Prescriptions  Medication Sig Dispense Refill  . acetaminophen (TYLENOL) 500 MG chewable tablet Chew 500-1,000 mg by mouth every 6 (six) hours as  needed. For pain      . aspirin 81 MG tablet Take 81 mg by mouth daily.      Marland Kitchen atorvastatin (LIPITOR) 20 MG tablet Take 20 mg by mouth at bedtime.       . donepezil (ARICEPT) 10 MG tablet Take 10 mg by mouth at bedtime.        . ergocalciferol (VITAMIN D2) 50000 UNITS capsule Take 50,000 Units by mouth once a week.        . esomeprazole (NEXIUM) 40 MG capsule Take 40 mg by mouth daily before breakfast.        . FeFum-FePoly-FA-B Cmp-C-Biot (INTEGRA PLUS) CAPS Take by mouth 3 (three) times daily.        . furosemide (LASIX) 20 MG tablet Take 20 mg by mouth daily.       Marland Kitchen labetalol (NORMODYNE) 200 MG tablet Take 100 mg by mouth 2 (two) times daily.       Marland Kitchen leflunomide (ARAVA) 20 MG tablet Take 20 mg by mouth daily.        Marland Kitchen levothyroxine (SYNTHROID, LEVOTHROID) 150 MCG tablet Take 150 mcg by mouth daily.        . montelukast (SINGULAIR) 10 MG tablet Take 10 mg by mouth at bedtime.      . NON FORMULARY Take by mouth  1 day or 1 dose. Pottasium liquid  1 tablspoon daily       . predniSONE (DELTASONE) 5 MG tablet Take 7.5 mg by mouth daily.       . vitamin C (ASCORBIC ACID) 500 MG tablet Take 500 mg by mouth daily.        . diphenoxylate-atropine (LOMOTIL) 2.5-0.025 MG per tablet Take 1-2 tablets by mouth daily as needed. For diarrhea        Allergies  Allergen Reactions  . Vioxx (Rofecoxib) Other (See Comments)    Unknown     Review of Systems negative except from HPI and PMH  Physical Exam BP 150/60  Pulse 74  Ht 5' 3.5" (1.613 m)  Wt 169 lb 6.4 oz (76.839 kg)  BMI 29.54 kg/m2 Well developed and well nourished in no acute distress HENT normal E scleral and icterus clear Neck Supple JVP8-10; parvus and tardus Clear to ausculation Regular rate and rhythm, diminished S1, a single S2 harsh 3-4/6 systolic murmur Soft with active bowel sounds No clubbing cyanosis r left greater than right 1+ edema Edema Alert and oriented, grossly normal motor and sensory function; bunion left  foot Skin Warm and Dry  ECG/74 Intervals 16/13/45 Axis CIX Right bundle branch block  Assessment and  Plan

## 2011-10-20 NOTE — Patient Instructions (Addendum)
Your physician has recommended you make the following change in your medication:  1) Increase lasix to 40 mg once daily.  Your physician has requested that you have an echocardiogram. Echocardiography is a painless test that uses sound waves to create images of your heart. It provides your doctor with information about the size and shape of your heart and how well your heart's chambers and valves are working. This procedure takes approximately one hour. There are no restrictions for this procedure.  You have been referred to : Dr. Excell Seltzer for evaluation of aortic stenosis.  Your physician wants you to follow-up in: 6 months with Dr. Graciela Husbands. You will receive a reminder letter in the mail two months in advance. If you don't receive a letter, please call our office to schedule the follow-up appointment.

## 2011-10-20 NOTE — Assessment & Plan Note (Signed)
Reasonably controlled 

## 2011-10-20 NOTE — Assessment & Plan Note (Signed)
She is having progressive congestive failure. We have reviewed the physiology of aortic stenosis at her last evaluation 2 years ago at which time the determination was made that she was not a surgical candidate. We have raised the issue today of TAVR and she would like to know whether she is a candidate for it. We will repeat her echo and refer her to Dr. Excell Seltzer for evaluation.  We'll also increase her Lasix from 20-40 mg a day. She is to follow up with Dr. Chestine Spore sooner rather than later as assessment of her renal function is key

## 2011-10-25 ENCOUNTER — Other Ambulatory Visit (HOSPITAL_COMMUNITY): Payer: Self-pay | Admitting: Radiology

## 2011-10-25 DIAGNOSIS — I350 Nonrheumatic aortic (valve) stenosis: Secondary | ICD-10-CM

## 2011-10-26 ENCOUNTER — Ambulatory Visit (HOSPITAL_COMMUNITY)
Admission: RE | Admit: 2011-10-26 | Discharge: 2011-10-26 | Disposition: A | Discharge status: Home / Self Care | Payer: Medicare Other | Source: Ambulatory Visit | Attending: Internal Medicine | Admitting: Internal Medicine

## 2011-10-26 ENCOUNTER — Ambulatory Visit (HOSPITAL_BASED_OUTPATIENT_CLINIC_OR_DEPARTMENT_OTHER): Payer: Medicare Other | Admitting: Radiology

## 2011-10-26 ENCOUNTER — Other Ambulatory Visit: Payer: Self-pay | Admitting: Internal Medicine

## 2011-10-26 DIAGNOSIS — R0609 Other forms of dyspnea: Secondary | ICD-10-CM

## 2011-10-26 DIAGNOSIS — R52 Pain, unspecified: Secondary | ICD-10-CM

## 2011-10-26 DIAGNOSIS — I359 Nonrheumatic aortic valve disorder, unspecified: Secondary | ICD-10-CM

## 2011-10-26 DIAGNOSIS — I350 Nonrheumatic aortic (valve) stenosis: Secondary | ICD-10-CM

## 2011-10-26 DIAGNOSIS — R197 Diarrhea, unspecified: Secondary | ICD-10-CM | POA: Insufficient documentation

## 2011-10-26 NOTE — Progress Notes (Signed)
Echocardiogram performed.  

## 2011-11-02 ENCOUNTER — Ambulatory Visit (INDEPENDENT_AMBULATORY_CARE_PROVIDER_SITE_OTHER): Payer: Medicare Other | Admitting: Cardiovascular Disease

## 2011-11-02 ENCOUNTER — Encounter: Payer: Self-pay | Admitting: Cardiovascular Disease

## 2011-11-02 ENCOUNTER — Encounter: Payer: Self-pay | Admitting: *Deleted

## 2011-11-02 VITALS — BP 142/58 | HR 64 | Ht 63.0 in | Wt 162.4 lb

## 2011-11-02 DIAGNOSIS — I359 Nonrheumatic aortic valve disorder, unspecified: Secondary | ICD-10-CM

## 2011-11-02 DIAGNOSIS — Z01810 Encounter for preprocedural cardiovascular examination: Secondary | ICD-10-CM

## 2011-11-02 DIAGNOSIS — I350 Nonrheumatic aortic (valve) stenosis: Secondary | ICD-10-CM

## 2011-11-02 NOTE — Patient Instructions (Addendum)

## 2011-11-02 NOTE — Progress Notes (Signed)
 HPI:  This is a 78-year-old woman referred for evaluation of severe aortic stenosis. The patient notes progressive shortness of breath and chest pressure with activity. This is been insidious over many months. Her symptoms have progressed to a point where she is short of breath with talking and very low-level activity. She also experiences a pressure like sensation across her chest, sometimes at rest and sometimes with activity. She has not required nitroglycerin. The patient admits to frequent lightheadedness and near syncope but has not had frank syncope. She has noted lower extremity edema which is improved after her furosemide dose was increased from 20-40 mg.  She has known aortic stenosis and has been followed for some time. However, recent echocardiogram shows marked progression of her aortic stenosis. The peak and mean transvalvular gradients are now 138 mmHg and 72 mmHg, respectively. The patient also has at least moderate mitral regurgitation. Her left ventricular function is preserved.  The patient has dementia. She has lived with her daughter since 2001 when she began having more significant problems with her memory. She still gets out to a senior center 4 days per week. She also goes to church. Other than those activity she does not get out of the house. She hasn't driven a car in many years. The patient ambulates with a walker.  Outpatient Encounter Prescriptions as of 11/02/2011  Medication Sig Dispense Refill  . acetaminophen (TYLENOL) 500 MG chewable tablet Chew 500-1,000 mg by mouth every 6 (six) hours as needed. For pain      . aspirin 81 MG tablet Take 81 mg by mouth daily.      . atorvastatin (LIPITOR) 20 MG tablet Take 20 mg by mouth at bedtime.       . donepezil (ARICEPT) 10 MG tablet Take 10 mg by mouth at bedtime.        . ergocalciferol (VITAMIN D2) 50000 UNITS capsule Take 50,000 Units by mouth once a week.        . esomeprazole (NEXIUM) 40 MG capsule Take 40 mg by mouth  daily before breakfast.        . ferrous sulfate 325 (65 FE) MG tablet Take 325 mg by mouth daily with breakfast.      . furosemide (LASIX) 40 MG tablet Take 1 tablet (40 mg total) by mouth daily.  30 tablet  6  . labetalol (NORMODYNE) 200 MG tablet Take 100 mg by mouth 2 (two) times daily.       . leflunomide (ARAVA) 20 MG tablet Take 20 mg by mouth daily.        . levothyroxine (SYNTHROID, LEVOTHROID) 150 MCG tablet Take 100 mcg by mouth daily.       . loperamide (IMODIUM) 2 MG capsule Take 2 mg by mouth as needed.      . montelukast (SINGULAIR) 10 MG tablet Take 10 mg by mouth at bedtime.      . NON FORMULARY Take by mouth 1 day or 1 dose. Pottasium liquid  1 tablspoon daily       . predniSONE (DELTASONE) 5 MG tablet Take 7.5 mg by mouth daily.       . vitamin C (ASCORBIC ACID) 500 MG tablet Take 500 mg by mouth daily.        . DISCONTD: FeFum-FePoly-FA-B Cmp-C-Biot (INTEGRA PLUS) CAPS Take by mouth 3 (three) times daily.        . DISCONTD: diphenoxylate-atropine (LOMOTIL) 2.5-0.025 MG per tablet Take 1-2 tablets by mouth daily as needed. For   diarrhea        Vioxx  Past Medical History  Diagnosis Date  . Chronic diastolic heart failure   . Rheumatoid arthritis   . Ulcer of esophagus with bleeding   . Essential hypertension, benign   . Benign neoplasm of colon   . Iron deficiency anemia secondary to blood loss (chronic)   . Syncope and collapse   . Pulmonary embolism     IVC filter  . Aortic stenosis     echo 4/11: mod LVH, mild focal basal septal hypertrophy, EF 55-60%, grade 1 diast dysfxn, critical AS, mild AI, AVA 0.6 cm2, mean gradient across AV 73 mmHg, mild MR, mild LAE, mild elevated PASP.  . Musculoskeletal chest pain   . Dementia   . Hypothyroidism   . HLD (hyperlipidemia)   . GI bleed     while on coumadin  . Diverticulosis     severe in the sigmond colon  . CAD (coronary artery disease)     LHC 11/2000: EF 60%, pLAD 25%, dLAD 20%, D1 30%, mCFX 30%, pOM2 50% and  lateral branch 60%, pRCA 20%.    Past Surgical History  Procedure Date  . Ivc filter 07/2009  . Knee arthroscopy   . Cataract extraction   . Polyp removal     sessile polyp inascending colon    History   Social History  . Marital Status: Widowed    Spouse Name: N/A    Number of Children: N/A  . Years of Education: N/A   Occupational History  . Not on file.   Social History Main Topics  . Smoking status: Never Smoker   . Smokeless tobacco: Never Used  . Alcohol Use: No  . Drug Use: No  . Sexually Active: Not on file   Other Topics Concern  . Not on file   Social History Narrative  . No narrative on file    Family History  Problem Relation Age of Onset  . Breast cancer Daughter   . Esophageal cancer Daughter   . Prostate cancer Other     nephew  . Diabetes Daughter   . Heart disease Other     family history    ROS: General: no fevers/chills/night sweats Eyes: no blurry vision, diplopia, or amaurosis ENT: no sore throat or hearing loss Resp: no cough, wheezing, or hemoptysis CV: no palpitations GI: no abdominal pain, nausea, vomiting, diarrhea, or constipation GU: no dysuria, frequency, or hematuria Skin: no rash Neuro: no headache, numbness, tingling, or weakness of extremities. Positive for short-term memory loss. Musculoskeletal: Diffuse pains in the hips and knees bilaterally Heme: no bleeding, DVT, or easy bruising Endo: no polydipsia or polyuria  BP 142/58  Pulse 64  Ht 5' 3" (1.6 m)  Wt 73.664 kg (162 lb 6.4 oz)  BMI 28.77 kg/m2  PHYSICAL EXAM: Pt is alert and oriented, very pleasant but frail elderly woman, in no distress. HEENT: normal Neck: JVP normal. Carotid upstrokes delayed with bilateral bruits. No thyromegaly. Lungs: equal expansion, clear bilaterally CV: Apex is discrete and nondisplaced, RRR with grade 4/6 harsh crescendo decrescendo murmur at the left sternal border  Abd: soft, NT, +BS, no bruit, no hepatosplenomegaly Back: no  CVA tenderness Ext: The legs are diffusely tender. There is 1+ edema bilaterally.  Skin: warm and dry without rash Neuro: CNII-XII intact             Strength intact = bilaterally  Echo: Study Conclusions  - Left ventricle: Wall thickness was increased   in a pattern of mild LVH. There was focal basal hypertrophy. Systolic function was normal. The estimated ejection fraction was in the range of 55% to 60%. - Aortic valve: Moderately to severely calcified annulus. Severely thickened, severely calcified leaflets. There was critical stenosis. Moderate regurgitation. - Mitral valve: Moderate regurgitation. - Left atrium: The atrium was mildly dilated. - Right atrium: The atrium was mildly dilated. - Pulmonary arteries: PA peak pressure: 59mm Hg (S).  EKG dated 10/20/2011: Normal sinus rhythm with right bundle branch block and rare PVCs   ASSESSMENT AND PLAN: This is a 78-year-old woman with critical aortic stenosis. I have reviewed her echo images, and there is no question that she has critical aortic stenosis. She also has moderate mitral regurgitation. I had a long discussion with the patient and her daughter today regarding this difficult situation. She understands that her risk of clinical deterioration and death in the setting of critical symptomatic aortic stenosis is quite high. She also understands that open surgical aortic valve replacement would not be a reasonable option in the setting of her advanced age, cognitive deficits, and limited mobility. She currently has NYHA Class 4 symptoms.  I discussed potential options, which I believe are either palliative balloon valvuloplasty or simply a shift towards comfort care. I reviewed the risks, indications, and alternatives to balloon aortic valvuloplasty with the patient and her daughter. We had a full discussion to include the fact that balloon valvuloplasty does not impact mortality when compared to medical therapy. This would be a  palliative procedure to provide symptomatic relief. They also understand the risks of balloon valvuloplasty include bleeding, major vascular injury, stroke, myocardial infarction, annular rupture, cardiac tamponade, and death. Because of her marked symptoms at rest and with minimal activity, they wish to proceed. The procedure will be scheduled in the next few weeks. She will undergo cardiac catheterization at the time of the procedure to exclude critical coronary disease. The patient has an IVC filter and history of pulmonary embolus. She will require temporary transvenous pacemaker placement and in my experience this has not been a significant issue with passing the transvenous wire through the IVC filter.  Depending on her clinical response to valvuloplasty, will consider followup in the multidisciplinary valve clinic.  Dayson Aboud 11/02/2011 6:45 PM      

## 2011-11-03 ENCOUNTER — Encounter (HOSPITAL_COMMUNITY): Payer: Self-pay | Admitting: Respiratory Therapy

## 2011-11-03 LAB — PROTIME-INR
INR: 1 ratio (ref 0.8–1.0)
Prothrombin Time: 11 s (ref 10.2–12.4)

## 2011-11-03 LAB — CBC WITH DIFFERENTIAL/PLATELET
Basophils Relative: 0.2 % (ref 0.0–3.0)
Eosinophils Relative: 0.2 % (ref 0.0–5.0)
HCT: 33 % — ABNORMAL LOW (ref 36.0–46.0)
MCV: 88 fl (ref 78.0–100.0)
Monocytes Absolute: 0.4 10*3/uL (ref 0.1–1.0)
Monocytes Relative: 5.4 % (ref 3.0–12.0)
Neutrophils Relative %: 81.4 % — ABNORMAL HIGH (ref 43.0–77.0)
RBC: 3.76 Mil/uL — ABNORMAL LOW (ref 3.87–5.11)
WBC: 7 10*3/uL (ref 4.5–10.5)

## 2011-11-03 LAB — BASIC METABOLIC PANEL
BUN: 16 mg/dL (ref 6–23)
CO2: 32 mEq/L (ref 19–32)
Chloride: 106 mEq/L (ref 96–112)
Glucose, Bld: 106 mg/dL — ABNORMAL HIGH (ref 70–99)
Potassium: 4.6 mEq/L (ref 3.5–5.1)
Sodium: 143 mEq/L (ref 135–145)

## 2011-11-15 ENCOUNTER — Other Ambulatory Visit: Payer: Self-pay | Admitting: Cardiovascular Disease

## 2011-11-16 ENCOUNTER — Ambulatory Visit (HOSPITAL_COMMUNITY): Payer: Medicare Other

## 2011-11-16 ENCOUNTER — Encounter (HOSPITAL_COMMUNITY): Payer: Self-pay

## 2011-11-16 ENCOUNTER — Encounter (HOSPITAL_COMMUNITY)
Admission: RE | Disposition: A | Discharge status: Home / Self Care | Payer: Self-pay | Source: Ambulatory Visit | Attending: Cardiovascular Disease

## 2011-11-16 ENCOUNTER — Ambulatory Visit (HOSPITAL_COMMUNITY)
Admission: RE | Admit: 2011-11-16 | Discharge: 2011-11-19 | Disposition: A | Discharge status: Home / Self Care | Payer: Medicare Other | Source: Ambulatory Visit | Attending: Cardiovascular Disease | Admitting: Cardiovascular Disease

## 2011-11-16 DIAGNOSIS — I5032 Chronic diastolic (congestive) heart failure: Secondary | ICD-10-CM

## 2011-11-16 DIAGNOSIS — I059 Rheumatic mitral valve disease, unspecified: Secondary | ICD-10-CM | POA: Insufficient documentation

## 2011-11-16 DIAGNOSIS — I359 Nonrheumatic aortic valve disorder, unspecified: Secondary | ICD-10-CM

## 2011-11-16 DIAGNOSIS — D500 Iron deficiency anemia secondary to blood loss (chronic): Secondary | ICD-10-CM

## 2011-11-16 DIAGNOSIS — M069 Rheumatoid arthritis, unspecified: Secondary | ICD-10-CM

## 2011-11-16 DIAGNOSIS — I2510 Atherosclerotic heart disease of native coronary artery without angina pectoris: Secondary | ICD-10-CM

## 2011-11-16 DIAGNOSIS — I10 Essential (primary) hypertension: Secondary | ICD-10-CM

## 2011-11-16 DIAGNOSIS — D126 Benign neoplasm of colon, unspecified: Secondary | ICD-10-CM

## 2011-11-16 DIAGNOSIS — R55 Syncope and collapse: Secondary | ICD-10-CM

## 2011-11-16 DIAGNOSIS — F0390 Unspecified dementia without behavioral disturbance: Secondary | ICD-10-CM | POA: Insufficient documentation

## 2011-11-16 DIAGNOSIS — I5030 Unspecified diastolic (congestive) heart failure: Secondary | ICD-10-CM | POA: Insufficient documentation

## 2011-11-16 DIAGNOSIS — R1319 Other dysphagia: Secondary | ICD-10-CM

## 2011-11-16 DIAGNOSIS — M6250 Muscle wasting and atrophy, not elsewhere classified, unspecified site: Secondary | ICD-10-CM | POA: Insufficient documentation

## 2011-11-16 DIAGNOSIS — F068 Other specified mental disorders due to known physiological condition: Secondary | ICD-10-CM

## 2011-11-16 DIAGNOSIS — I2699 Other pulmonary embolism without acute cor pulmonale: Secondary | ICD-10-CM

## 2011-11-16 DIAGNOSIS — R197 Diarrhea, unspecified: Secondary | ICD-10-CM

## 2011-11-16 DIAGNOSIS — I509 Heart failure, unspecified: Secondary | ICD-10-CM | POA: Insufficient documentation

## 2011-11-16 DIAGNOSIS — K2211 Ulcer of esophagus with bleeding: Secondary | ICD-10-CM

## 2011-11-16 DIAGNOSIS — R42 Dizziness and giddiness: Secondary | ICD-10-CM

## 2011-11-16 DIAGNOSIS — R400 Somnolence: Secondary | ICD-10-CM

## 2011-11-16 HISTORY — PX: LEFT AND RIGHT HEART CATHETERIZATION WITH CORONARY ANGIOGRAM: SHX5449

## 2011-11-16 HISTORY — PX: TEMPORARY PACEMAKER INSERTION: SHX5471

## 2011-11-16 HISTORY — PX: BALLOON VALVULOPLASTY: SHX5741

## 2011-11-16 LAB — MRSA PCR SCREENING: MRSA by PCR: NEGATIVE

## 2011-11-16 LAB — POCT ACTIVATED CLOTTING TIME
Activated Clotting Time: 189 seconds
Activated Clotting Time: 204 seconds

## 2011-11-16 SURGERY — TEMPORARY PACEMAKER INSERTION
Anesthesia: LOCAL

## 2011-11-16 MED ORDER — ACETAMINOPHEN 325 MG PO TABS
650.0000 mg | ORAL_TABLET | ORAL | Status: DC | PRN
  Administered 2011-11-18: 650 mg via ORAL
  Filled 2011-11-16: qty 2

## 2011-11-16 MED ORDER — LEFLUNOMIDE 20 MG PO TABS
20.0000 mg | ORAL_TABLET | Freq: Every day | ORAL | Status: DC
  Administered 2011-11-17 – 2011-11-19 (×3): 20 mg via ORAL
  Filled 2011-11-16 (×3): qty 1

## 2011-11-16 MED ORDER — FENTANYL CITRATE 0.05 MG/ML IJ SOLN
INTRAMUSCULAR | Status: AC
  Filled 2011-11-16: qty 2

## 2011-11-16 MED ORDER — HEPARIN SODIUM (PORCINE) 1000 UNIT/ML IJ SOLN
INTRAMUSCULAR | Status: AC
  Filled 2011-11-16: qty 1

## 2011-11-16 MED ORDER — ONDANSETRON HCL 4 MG/2ML IJ SOLN: 4.0000 mg | Freq: Four times a day (QID) | INTRAMUSCULAR | Status: DC | PRN

## 2011-11-16 MED ORDER — SODIUM CHLORIDE 0.9 % IJ SOLN: 3.0000 mL | INTRAMUSCULAR | Status: DC | PRN

## 2011-11-16 MED ORDER — LOPERAMIDE HCL 2 MG PO CAPS: 2.0000 mg | ORAL_CAPSULE | ORAL | Status: DC | PRN

## 2011-11-16 MED ORDER — ASPIRIN 81 MG PO CHEW
324.0000 mg | CHEWABLE_TABLET | ORAL | Status: AC
  Administered 2011-11-16: 324 mg via ORAL
  Filled 2011-11-16: qty 4

## 2011-11-16 MED ORDER — MONTELUKAST SODIUM 10 MG PO TABS
10.0000 mg | ORAL_TABLET | Freq: Every day | ORAL | Status: DC
  Administered 2011-11-16 – 2011-11-18 (×3): 10 mg via ORAL
  Filled 2011-11-16 (×4): qty 1

## 2011-11-16 MED ORDER — NOREPINEPHRINE BITARTRATE 1 MG/ML IJ SOLN
INTRAMUSCULAR | Status: AC
  Filled 2011-11-16: qty 4

## 2011-11-16 MED ORDER — FUROSEMIDE 10 MG/ML IJ SOLN
40.0000 mg | Freq: Once | INTRAMUSCULAR | Status: AC
  Administered 2011-11-16: 40 mg via INTRAVENOUS

## 2011-11-16 MED ORDER — SODIUM CHLORIDE 0.9 % IV SOLN: 250.0000 mL | INTRAVENOUS | Status: DC | PRN

## 2011-11-16 MED ORDER — LABETALOL HCL 100 MG PO TABS
100.0000 mg | ORAL_TABLET | Freq: Two times a day (BID) | ORAL | Status: DC
  Administered 2011-11-16 – 2011-11-19 (×6): 100 mg via ORAL
  Filled 2011-11-16 (×7): qty 1

## 2011-11-16 MED ORDER — LEVOTHYROXINE SODIUM 100 MCG PO TABS
100.0000 ug | ORAL_TABLET | Freq: Every day | ORAL | Status: DC
  Administered 2011-11-17 – 2011-11-19 (×3): 100 ug via ORAL
  Filled 2011-11-16 (×3): qty 1

## 2011-11-16 MED ORDER — VITAMIN C 500 MG PO TABS
500.0000 mg | ORAL_TABLET | Freq: Every day | ORAL | Status: DC
  Administered 2011-11-17 – 2011-11-19 (×3): 500 mg via ORAL
  Filled 2011-11-16 (×3): qty 1

## 2011-11-16 MED ORDER — MIDAZOLAM HCL 2 MG/2ML IJ SOLN
INTRAMUSCULAR | Status: AC
  Filled 2011-11-16: qty 2

## 2011-11-16 MED ORDER — CEFAZOLIN SODIUM 1-5 GM-% IV SOLN
1.0000 g | Freq: Once | INTRAVENOUS | Status: AC
  Administered 2011-11-16: 1 g via INTRAVENOUS

## 2011-11-16 MED ORDER — NITROGLYCERIN 0.2 MG/ML ON CALL CATH LAB
INTRAVENOUS | Status: AC
  Filled 2011-11-16: qty 1

## 2011-11-16 MED ORDER — LIDOCAINE HCL (PF) 1 % IJ SOLN
INTRAMUSCULAR | Status: AC
  Filled 2011-11-16: qty 30

## 2011-11-16 MED ORDER — ASPIRIN 81 MG PO TABS: 81.0000 mg | ORAL_TABLET | Freq: Every day | ORAL | Status: DC

## 2011-11-16 MED ORDER — FUROSEMIDE 40 MG PO TABS
40.0000 mg | ORAL_TABLET | Freq: Every day | ORAL | Status: DC
  Administered 2011-11-17 – 2011-11-19 (×3): 40 mg via ORAL
  Filled 2011-11-16 (×3): qty 1

## 2011-11-16 MED ORDER — ATORVASTATIN CALCIUM 20 MG PO TABS
20.0000 mg | ORAL_TABLET | Freq: Every day | ORAL | Status: DC
  Administered 2011-11-16 – 2011-11-18 (×3): 20 mg via ORAL
  Filled 2011-11-16 (×4): qty 1

## 2011-11-16 MED ORDER — PREDNISONE 5 MG PO TABS: 7.5000 mg | ORAL_TABLET | Freq: Every day | ORAL | Status: DC

## 2011-11-16 MED ORDER — SODIUM CHLORIDE 0.9 % IV SOLN: 1.0000 mL/kg/h | INTRAVENOUS | Status: DC

## 2011-11-16 MED ORDER — ASPIRIN EC 81 MG PO TBEC
81.0000 mg | DELAYED_RELEASE_TABLET | Freq: Every day | ORAL | Status: DC
Start: 2011-11-17 — End: 2011-11-19
  Administered 2011-11-17 – 2011-11-19 (×3): 81 mg via ORAL
  Filled 2011-11-16 (×3): qty 1

## 2011-11-16 MED ORDER — SODIUM CHLORIDE 0.9 % IJ SOLN
3.0000 mL | Freq: Two times a day (BID) | INTRAMUSCULAR | Status: DC
  Administered 2011-11-17 – 2011-11-19 (×5): 3 mL via INTRAVENOUS

## 2011-11-16 MED ORDER — SODIUM CHLORIDE 0.9 % IV SOLN: 250.0000 mL | INTRAVENOUS | Status: DC

## 2011-11-16 MED ORDER — LABETALOL HCL 100 MG PO TABS
100.0000 mg | ORAL_TABLET | Freq: Two times a day (BID) | ORAL | Status: DC
  Administered 2011-11-16: 100 mg via ORAL
  Filled 2011-11-16 (×5): qty 1

## 2011-11-16 MED ORDER — DIAZEPAM 2 MG PO TABS
2.0000 mg | ORAL_TABLET | ORAL | Status: AC
  Administered 2011-11-16: 2 mg via ORAL
  Filled 2011-11-16: qty 1

## 2011-11-16 MED ORDER — FERROUS SULFATE 325 (65 FE) MG PO TABS
325.0000 mg | ORAL_TABLET | Freq: Every day | ORAL | Status: DC
  Administered 2011-11-17 – 2011-11-19 (×3): 325 mg via ORAL
  Filled 2011-11-16 (×4): qty 1

## 2011-11-16 MED ORDER — PREDNISONE 5 MG PO TABS
7.5000 mg | ORAL_TABLET | Freq: Every day | ORAL | Status: DC
Start: 2011-11-16 — End: 2011-11-19
  Administered 2011-11-17 – 2011-11-19 (×3): 7.5 mg via ORAL
  Filled 2011-11-16 (×5): qty 1

## 2011-11-16 MED ORDER — PANTOPRAZOLE SODIUM 40 MG PO TBEC
40.0000 mg | DELAYED_RELEASE_TABLET | Freq: Every day | ORAL | Status: DC
  Administered 2011-11-17 – 2011-11-19 (×3): 40 mg via ORAL
  Filled 2011-11-16 (×2): qty 1

## 2011-11-16 MED ORDER — DONEPEZIL HCL 10 MG PO TABS
10.0000 mg | ORAL_TABLET | Freq: Every day | ORAL | Status: DC
  Administered 2011-11-16 – 2011-11-18 (×3): 10 mg via ORAL
  Filled 2011-11-16 (×4): qty 1

## 2011-11-16 MED ORDER — SODIUM CHLORIDE 0.9 % IJ SOLN
3.0000 mL | Freq: Two times a day (BID) | INTRAMUSCULAR | Status: DC
  Administered 2011-11-16: 3 mL via INTRAVENOUS

## 2011-11-16 MED ORDER — SODIUM CHLORIDE 0.9 % IV SOLN
INTRAVENOUS | Status: DC
  Administered 2011-11-16: 07:00:00 via INTRAVENOUS

## 2011-11-16 MED ORDER — HEPARIN (PORCINE) IN NACL 2-0.9 UNIT/ML-% IJ SOLN
INTRAMUSCULAR | Status: AC
  Filled 2011-11-16: qty 2000

## 2011-11-16 MED ORDER — ONDANSETRON HCL 4 MG/2ML IJ SOLN
INTRAMUSCULAR | Status: AC
  Filled 2011-11-16: qty 2

## 2011-11-16 MED ORDER — FUROSEMIDE 10 MG/ML IJ SOLN
INTRAMUSCULAR | Status: AC
  Filled 2011-11-16: qty 4

## 2011-11-16 MED ORDER — CEFAZOLIN SODIUM 1-5 GM-% IV SOLN
INTRAVENOUS | Status: AC
  Filled 2011-11-16: qty 50

## 2011-11-16 NOTE — Progress Notes (Signed)
*  PRELIMINARY RESULTS* Echocardiogram 2D Echocardiogram has been performed.  [name] 11/16/2011, 11:47 AM

## 2011-11-16 NOTE — H&P (View-Only) (Signed)
HPI:  This is a 78 year old woman referred for evaluation of severe aortic stenosis. The patient notes progressive shortness of breath and chest pressure with activity. This is been insidious over many months. Her symptoms have progressed to a point where she is short of breath with talking and very low-level activity. She also experiences a pressure like sensation across her chest, sometimes at rest and sometimes with activity. She has not required nitroglycerin. The patient admits to frequent lightheadedness and near syncope but has not had frank syncope. She has noted lower extremity edema which is improved after her furosemide dose was increased from 20-40 mg.  She has known aortic stenosis and has been followed for some time. However, recent echocardiogram shows marked progression of her aortic stenosis. The peak and mean transvalvular gradients are now 138 mmHg and 72 mmHg, respectively. The patient also has at least moderate mitral regurgitation. Her left ventricular function is preserved.  The patient has dementia. She has lived with her daughter since 2001 when she began having more significant problems with her memory. She still gets out to a senior center 4 days per week. She also goes to church. Other than those activity she does not get out of the house. She hasn't driven a car in many years. The patient ambulates with a walker.  Outpatient Encounter Prescriptions as of 11/02/2011  Medication Sig Dispense Refill  . acetaminophen (TYLENOL) 500 MG chewable tablet Chew 500-1,000 mg by mouth every 6 (six) hours as needed. For pain      . aspirin 81 MG tablet Take 81 mg by mouth daily.      Marland Kitchen atorvastatin (LIPITOR) 20 MG tablet Take 20 mg by mouth at bedtime.       . donepezil (ARICEPT) 10 MG tablet Take 10 mg by mouth at bedtime.        . ergocalciferol (VITAMIN D2) 50000 UNITS capsule Take 50,000 Units by mouth once a week.        . esomeprazole (NEXIUM) 40 MG capsule Take 40 mg by mouth  daily before breakfast.        . ferrous sulfate 325 (65 FE) MG tablet Take 325 mg by mouth daily with breakfast.      . furosemide (LASIX) 40 MG tablet Take 1 tablet (40 mg total) by mouth daily.  30 tablet  6  . labetalol (NORMODYNE) 200 MG tablet Take 100 mg by mouth 2 (two) times daily.       Marland Kitchen leflunomide (ARAVA) 20 MG tablet Take 20 mg by mouth daily.        Marland Kitchen levothyroxine (SYNTHROID, LEVOTHROID) 150 MCG tablet Take 100 mcg by mouth daily.       Marland Kitchen loperamide (IMODIUM) 2 MG capsule Take 2 mg by mouth as needed.      . montelukast (SINGULAIR) 10 MG tablet Take 10 mg by mouth at bedtime.      . NON FORMULARY Take by mouth 1 day or 1 dose. Pottasium liquid  1 tablspoon daily       . predniSONE (DELTASONE) 5 MG tablet Take 7.5 mg by mouth daily.       . vitamin C (ASCORBIC ACID) 500 MG tablet Take 500 mg by mouth daily.        Marland Kitchen DISCONTD: FeFum-FePoly-FA-B Cmp-C-Biot (INTEGRA PLUS) CAPS Take by mouth 3 (three) times daily.        Marland Kitchen DISCONTD: diphenoxylate-atropine (LOMOTIL) 2.5-0.025 MG per tablet Take 1-2 tablets by mouth daily as needed. For  diarrhea        Vioxx  Past Medical History  Diagnosis Date  . Chronic diastolic heart failure   . Rheumatoid arthritis   . Ulcer of esophagus with bleeding   . Essential hypertension, benign   . Benign neoplasm of colon   . Iron deficiency anemia secondary to blood loss (chronic)   . Syncope and collapse   . Pulmonary embolism     IVC filter  . Aortic stenosis     echo 4/11: mod LVH, mild focal basal septal hypertrophy, EF 55-60%, grade 1 diast dysfxn, critical AS, mild AI, AVA 0.6 cm2, mean gradient across AV 73 mmHg, mild MR, mild LAE, mild elevated PASP.  Marland Kitchen Musculoskeletal chest pain   . Dementia   . Hypothyroidism   . HLD (hyperlipidemia)   . GI bleed     while on coumadin  . Diverticulosis     severe in the sigmond colon  . CAD (coronary artery disease)     LHC 11/2000: EF 60%, pLAD 25%, dLAD 20%, D1 30%, mCFX 30%, pOM2 50% and  lateral branch 60%, pRCA 20%.    Past Surgical History  Procedure Date  . Ivc filter 07/2009  . Knee arthroscopy   . Cataract extraction   . Polyp removal     sessile polyp inascending colon    History   Social History  . Marital Status: Widowed    Spouse Name: N/A    Number of Children: N/A  . Years of Education: N/A   Occupational History  . Not on file.   Social History Main Topics  . Smoking status: Never Smoker   . Smokeless tobacco: Never Used  . Alcohol Use: No  . Drug Use: No  . Sexually Active: Not on file   Other Topics Concern  . Not on file   Social History Narrative  . No narrative on file    Family History  Problem Relation Age of Onset  . Breast cancer Daughter   . Esophageal cancer Daughter   . Prostate cancer Other     nephew  . Diabetes Daughter   . Heart disease Other     family history    ROS: General: no fevers/chills/night sweats Eyes: no blurry vision, diplopia, or amaurosis ENT: no sore throat or hearing loss Resp: no cough, wheezing, or hemoptysis CV: no palpitations GI: no abdominal pain, nausea, vomiting, diarrhea, or constipation GU: no dysuria, frequency, or hematuria Skin: no rash Neuro: no headache, numbness, tingling, or weakness of extremities. Positive for short-term memory loss. Musculoskeletal: Diffuse pains in the hips and knees bilaterally Heme: no bleeding, DVT, or easy bruising Endo: no polydipsia or polyuria  BP 142/58  Pulse 64  Ht 5\' 3"  (1.6 m)  Wt 73.664 kg (162 lb 6.4 oz)  BMI 28.77 kg/m2  PHYSICAL EXAM: Pt is alert and oriented, very pleasant but frail elderly woman, in no distress. HEENT: normal Neck: JVP normal. Carotid upstrokes delayed with bilateral bruits. No thyromegaly. Lungs: equal expansion, clear bilaterally CV: Apex is discrete and nondisplaced, RRR with grade 4/6 harsh crescendo decrescendo murmur at the left sternal border  Abd: soft, NT, +BS, no bruit, no hepatosplenomegaly Back: no  CVA tenderness Ext: The legs are diffusely tender. There is 1+ edema bilaterally.  Skin: warm and dry without rash Neuro: CNII-XII intact             Strength intact = bilaterally  Echo: Study Conclusions  - Left ventricle: Wall thickness was increased  in a pattern of mild LVH. There was focal basal hypertrophy. Systolic function was normal. The estimated ejection fraction was in the range of 55% to 60%. - Aortic valve: Moderately to severely calcified annulus. Severely thickened, severely calcified leaflets. There was critical stenosis. Moderate regurgitation. - Mitral valve: Moderate regurgitation. - Left atrium: The atrium was mildly dilated. - Right atrium: The atrium was mildly dilated. - Pulmonary arteries: PA peak pressure: 59mm Hg (S).  EKG dated 10/20/2011: Normal sinus rhythm with right bundle branch block and rare PVCs   ASSESSMENT AND PLAN: This is a 78 year old woman with critical aortic stenosis. I have reviewed her echo images, and there is no question that she has critical aortic stenosis. She also has moderate mitral regurgitation. I had a long discussion with the patient and her daughter today regarding this difficult situation. She understands that her risk of clinical deterioration and death in the setting of critical symptomatic aortic stenosis is quite high. She also understands that open surgical aortic valve replacement would not be a reasonable option in the setting of her advanced age, cognitive deficits, and limited mobility. She currently has NYHA Class 4 symptoms.  I discussed potential options, which I believe are either palliative balloon valvuloplasty or simply a shift towards comfort care. I reviewed the risks, indications, and alternatives to balloon aortic valvuloplasty with the patient and her daughter. We had a full discussion to include the fact that balloon valvuloplasty does not impact mortality when compared to medical therapy. This would be a  palliative procedure to provide symptomatic relief. They also understand the risks of balloon valvuloplasty include bleeding, major vascular injury, stroke, myocardial infarction, annular rupture, cardiac tamponade, and death. Because of her marked symptoms at rest and with minimal activity, they wish to proceed. The procedure will be scheduled in the next few weeks. She will undergo cardiac catheterization at the time of the procedure to exclude critical coronary disease. The patient has an IVC filter and history of pulmonary embolus. She will require temporary transvenous pacemaker placement and in my experience this has not been a significant issue with passing the transvenous wire through the IVC filter.  Depending on her clinical response to valvuloplasty, will consider followup in the multidisciplinary valve clinic.  Tonny Bollman 11/02/2011 6:45 PM

## 2011-11-16 NOTE — Interval H&P Note (Signed)
History and Physical Interval Note:  11/16/2011 8:34 AM  [name]  has presented today for surgery, with the diagnosis of AS  The various methods of treatment have been discussed with the patient and family. After consideration of risks, benefits and other options for treatment, the patient has consented to  Procedure(s) (LRB): TEMPORARY PACEMAKER INSERTION (N/A) LEFT AND RIGHT HEART CATHETERIZATION WITH CORONARY ANGIOGRAM (N/A) BALLOON VALVULOPLASTY (N/A) as a surgical intervention .  The patient's history has been reviewed, patient examined, no change in status, stable for surgery.  I have reviewed the patients' chart and labs.  Questions were answered to the patient's satisfaction.    The patient has been seen and reevaluated. I discussed the procedure again in depth with the patient and daughter. Plans are for coronary angiography, and if no critical stenoses will proceed with balloon valvuloplasty of the aortic valve. I have reviewed the risks, indications, and alternatives to this procedure in detail with the patient and her daughter. They understand and agree to proceed. This is a palliative treatment for this elderly patient and they understand she is not a candidate for open cardiac surgery in case of a severe complication.  Tonny Bollman 11/16/2011 8:36 AM

## 2011-11-16 NOTE — CV Procedure (Signed)
Cardiac Catheterization Procedure Note  Name: [name] MRN: [id number] DOB: [birth year]  Procedure: Left Heart Cath, Selective Coronary Angiography, LV angiography, temporary transvenous pacemaker placement, right heart catheterization, balloon aortic valvuloplasty.  Indication: Critical aortic stenosis   Procedural details: The right groin was prepped, draped, and anesthetized with 1% lidocaine. Using modified Seldinger technique, a 5 French sheath was introduced into the right femoral artery and a 7 French sheath was introduced into the right femoral vein. Standard Judkins catheters were used for coronary angiography to rule out critical coronary artery disease. Catheter exchanges were performed over a guidewire. An end hole multipurpose catheter was used for the right heart catheterization. Pressures were recorded throughout the right heart chambers. A temporary transvenous pacemaking wire was then advanced into the apex of the right ventricle. At that point attention was turned to crossing the aortic valve. There was a 10 French sheath placed in the right femoral artery it was exchanged out for the previous 5 [place] sheath. An AL-1 catheter and straight-tip wire were used to cross the valve. A pigtail catheter was then advanced into the left ventricle and simultaneous pressures were recorded. Weight-based unfractionated heparin was administered. Pacing threshold was 0.4 and the output was set at 10. I attempted to advance an 18 mm valvuloplasty balloon through the 10 French sheath but it would not go through the sheath. The sheath was then upsized over-the-wire to a 12 [place] sheath. A pigtail catheter had been removed and an Amplatz superstiff wire and advanced into the left ventricular apex. The balloon easily passed through the 12 French sheath and was positioned across the aortic valve. The patient underwent rapid ventricular pacing at 180 beats per minute. The balloon was inflated a hand  injection for a total of 2 inflations. The patient tolerated this well. A pigtail catheter was advanced back across the valve and followup hemodynamics were obtained. The patient tolerated the procedure well. The catheters and temporary transvenous pacemaker wire were both removed. There were no immediate procedural complications. The patient was transferred to the post catheterization recovery area for further monitoring.  Procedural Findings: Hemodynamics:  AO 189/61 LV 227/14  AO (post): 180/58 LV (post): 216/15   RA 10 RV 43/10 PA 40/21 with a mean of 32 Pulmonary capillary wedge pressure 25   Coronary angiography: Coronary dominance: right  Left mainstem: Widely patent, heavily calcified.  Left anterior descending (LAD): The LAD is patent to the left ventricular apex. The vessel wraps around the apex. There is moderate calcification with mild diffuse nonobstructive disease present. There are 2 diagonals without significant stenosis.  Left circumflex (LCx): The left circumflex is a large vessel, there are no significant stenoses present. The obtuse marginal branch divides into twin vessels without significant stenosis.  Right coronary artery (RCA): The right coronary artery is codominant with the circumflex. The ostium is heavily calcified with 50% stenosis. There is mild diffuse nonobstructive disease throughout  Left ventriculography: Left ventricular function is vigorous. The ejection fraction is estimated at 65%. The aortic valve is heavily calcified. Mitral annulus is heavily calcified. There is 2-3+ mitral regurgitation  Final Conclusions:   1. Mild diffuse nonobstructive coronary artery disease 2. Normal left ventricular systolic function with moderate mitral regurgitation 3. Severe aortic stenosis with uncomplicated balloon aortic valvuloplasty with modest reduction in transaortic valve gradient   Recommendations: The patient will be transferred to the CCU. Will check a  stat 2-D echocardiogram. She will be monitored closely and I will obtain a physical therapy  and occupational therapy consultation.  Tonny Bollman 11/16/2011, 9:56 AM

## 2011-11-17 DIAGNOSIS — I359 Nonrheumatic aortic valve disorder, unspecified: Secondary | ICD-10-CM

## 2011-11-17 LAB — BASIC METABOLIC PANEL
GFR calc Af Amer: 43 mL/min — ABNORMAL LOW (ref >90)
GFR calc non Af Amer: 37 mL/min — ABNORMAL LOW (ref >90)
Potassium: 3.8 mEq/L (ref 3.5–5.1)
Sodium: 143 mEq/L (ref 135–145)

## 2011-11-17 LAB — CBC
Hemoglobin: 9.5 g/dL — ABNORMAL LOW (ref 12.0–15.0)
RBC: 3.48 MIL/uL — ABNORMAL LOW (ref 3.87–5.11)

## 2011-11-17 MED ORDER — ENSURE COMPLETE PO LIQD
237.0000 mL | Freq: Two times a day (BID) | ORAL | Status: DC
  Administered 2011-11-17 – 2011-11-19 (×5): 237 mL via ORAL

## 2011-11-17 NOTE — Evaluation (Signed)
Physical Therapy Evaluation Patient Details Name: Oaklie Durrett MRN: [id number] DOB: Oct 04, 1917 Today's Date: 11/17/2011 Time: 8295-6213 PT Time Calculation (min): 23 min  PT Assessment / Plan / Recommendation Clinical Impression  Patient presents S/P valvuloplasty due to severe aortic stenosis. She has excellent caregiver support at home, but her daughter is currently having some back issues that will require patient to be at a level where she does not require physical assistance at discharge. We will follow to attempt to  ensure that patient progresses with her functional mobility sufficient for her to discharge home.     PT Assessment  Patient needs continued PT services    Follow Up Recommendations   (Potential SNF if patient's mobility is not min.guard )    Barriers to Discharge  None      Equipment Recommendations  None recommended by PT    Recommendations for Other Services  None   Frequency Min 3X/week    Precautions / Restrictions Precautions Precautions: Fall Restrictions Weight Bearing Restrictions: No   Pertinent Vitals/Pain VSS/ No pain      Mobility  Bed Mobility Bed Mobility: Supine to Sit;Sitting - Scoot to Edge of Bed Supine to Sit: 5: Supervision Sitting - Scoot to Edge of Bed: 4: Min assist Details for Bed Mobility Assistance: Patient required assistance for scooting to edge only as difficulty initiating on softer surface.  Transfers Transfers: Sit to Stand;Stand to Sit;Stand Pivot Transfers Sit to Stand: From chair/3-in-1;From bed;With upper extremity assist;4: Min assist;4: Min guard Stand to Sit: 4: Min guard;With upper extremity assist;To chair/3-in-1 Stand Pivot Transfers: 4: Min assist Details for Transfer Assistance: Assistance required for standing varied. From bed without arm rests and at lowest height patient required assistance to initiate, but from 3in 1 and recliner patient did not require physical assistance with minimal upper extremity  use on arm rests. Patient very tentaive with pivotal stepping to 3 in 1 without the walker. Assisted with toileting hygiene.  Ambulation/Gait Ambulation/Gait Assistance: 4: Min guard Ambulation Distance (Feet): 60 Feet Assistive device: Rolling walker Ambulation/Gait Assistance Details: Patient with significantly improved functional efficiency with gait with walker.  Gait Pattern: Within Functional Limits    PT Diagnosis: Difficulty walking  PT Problem List: Decreased activity tolerance;Decreased cognition;Decreased mobility;Decreased balance PT Treatment Interventions: Gait training;Stair training;DME instruction;Therapeutic activities;Therapeutic exercise;Balance training;Cognitive remediation   PT Goals Acute Rehab PT Goals PT Goal Formulation: With family Time For Goal Achievement: 11/24/11 Potential to Achieve Goals: Good Pt will go Supine/Side to Sit: with supervision PT Goal: Supine/Side to Sit - Progress: Goal set today Pt will go Sit to Supine/Side: with supervision PT Goal: Sit to Supine/Side - Progress: Goal set today Pt will go Sit to Stand: with supervision;with upper extremity assist PT Goal: Sit to Stand - Progress: Goal set today Pt will go Stand to Sit: with supervision;with upper extremity assist PT Goal: Stand to Sit - Progress: Goal set today Pt will Ambulate: >150 feet;with supervision PT Goal: Ambulate - Progress: Goal set today Pt will Go Up / Down Stairs: Flight;with rail(s) (min-guard) PT Goal: Up/Down Stairs - Progress: Goal set today  Visit Information  Last PT Received On: 11/17/11 Assistance Needed: +1    Subjective Data  Subjective: Patient reports need for toilet Patient Stated Goal: Would like to walk   Prior Functioning  Home Living Lives With: Daughter Available Help at Discharge: Available 24 hours/day;Family ([facility] x a week) Type of Home: House Home Access: Stairs to enter [employer] of Steps: 2  Entrance  Stairs-Rails: None Home Layout: Two level Alternate Level Stairs-Number of Steps: 13 Alternate Level Stairs-Rails: Right;Left;Can reach both Bathroom Shower/Tub: Tub/shower unit Home Adaptive Equipment: Environmental consultant - four wheeled;Hospital bed;Bedside commode/3-in-1;Shower chair with back;Grab bars in shower Additional Comments: Daughter feels she needs a grab bar on the outside area of the shower. Prior Function Level of Independence: Needs assistance Needs Assistance: Dressing;Bathing Bath: Moderate Dressing: Moderate Meal Prep: Total Light Housekeeping: Total Able to Take Stairs?: Yes (with supervision) Comments: Daughter reports she is having back problems and seeing a specialist tomorrow. She states that she can not care for her mother at present unless she is indep. with transfers, walking, and getting to the toilet.  Communication Communication: No difficulties    Cognition  Overall Cognitive Status: History of cognitive impairments - at baseline Arousal/Alertness: Awake/alert Orientation Level: Disoriented to;Situation;Time Behavior During Session: WFL for tasks performed    Extremity/Trunk Assessment Right Lower Extremity Assessment RLE ROM/Strength/Tone: [facility] for tasks assessed RLE Coordination: WFL - gross/fine motor Left Lower Extremity Assessment LLE ROM/Strength/Tone: WFL for tasks assessed LLE Coordination: WFL - gross/fine motor Trunk Assessment Trunk Assessment: Normal   Balance High Level Balance High Level Balance Activites: Turns;Direction changes High Level Balance Comments: close guarding only.   End of Session PT - End of Session Equipment Utilized During Treatment: Gait belt Activity Tolerance: Patient tolerated treatment well Patient left: in chair;with call bell/phone within reach Nurse Communication: Mobility status   Edwyna Perfect, PT  Pager [phone]  11/17/2011, 11:52 AM

## 2011-11-17 NOTE — Progress Notes (Signed)
Co sign for IV charting, assessments, and meds for Rashida Haney RN 7a-7p. Nino Glow RN

## 2011-11-17 NOTE — Clinical Social Work Psychosocial (Signed)
     Clinical Social Work Department BRIEF PSYCHOSOCIAL ASSESSMENT 11/17/2011  Patient:  [name], [name]     Account Number:  [id number]     Admit date:  11/16/2011  Clinical Social Worker:  Doree Albee  Date/Time:  11/17/2011 11:35 AM  Referred by:  RN  Date Referred:  11/17/2011 Referred for  ALF Placement   Other Referral:   Interview type:  Patient Other interview type:    PSYCHOSOCIAL DATA Living Status:  FAMILY Admitted from facility:   Level of care:   Primary support name:  Vivian Neuwirth Primary support relationship to patient:  CHILD, ADULT Degree of support available:   strong    CURRENT CONCERNS Current Concerns  Post-Acute Placement   Other Concerns:    SOCIAL WORK ASSESSMENT / PLAN CSW met with pt at bedside to discuss pt current living environemnt and pt discharge plans.    Pt shared that she currently lives at home iwth pt daughter [name] and recieves assistance from [facility] home health aid majority of the day.    Pt states that she plans to discharge home whenever medically stable.    Per chart review, pt MD is requesting pt/ot evaluation to help determine pt dipsoition needs.    Pt shared that she has 6 children that live in the area who all help with patient daily care and needs.    CSW informed rn case Production designer, theatre/television/film. CSW will continue to follow to assist with pt dispotion needs once pt/ot evaltuion is completed.   Assessment/plan status:  Psychosocial Support/Ongoing Assessment of Needs Other assessment/ plan:   and discharge plan   Information/referral to community resources:   none at this time. Pt well connected with community [facility] program as well as strong supportive family of 6 adult children    PATIENTS/FAMILYS RESPONSE TO PLAN OF CARE: Pt thanked csw for concern and support. Pt hopes to return home when medically stable and is open to home health services only at this time.

## 2011-11-17 NOTE — Progress Notes (Signed)
TRANSFERRED TO [id number] BY WHEELCHAIR, STABLE, BELONGINGS WITH PT. REPORT GIVEN TO RN.

## 2011-11-17 NOTE — Progress Notes (Signed)
Patient was seen independently walking in hallway going into another patients room. Redirected patient. Vitals taken, B/P - 171/66 and HR-75. Notified MD. No orders given. Will continue to monitor to end of shift.

## 2011-11-17 NOTE — Progress Notes (Signed)
INITIAL ADULT NUTRITION ASSESSMENT Date: 11/17/2011   Time: 10:18 AM Reason for Assessment: Nutrition Risk report   ASSESSMENT: [name]  Dx: AS, cardiac cath, TPM placement, balloon valvuloplasty   Hx:  Past Medical History  Diagnosis Date  . Chronic diastolic heart failure   . Rheumatoid arthritis   . Ulcer of esophagus with bleeding   . Essential hypertension, benign   . Benign neoplasm of colon   . Iron deficiency anemia secondary to blood loss (chronic)   . Syncope and collapse   . Pulmonary embolism     IVC filter  . Aortic stenosis     echo 4/11: mod LVH, mild focal basal septal hypertrophy, EF 55-60%, grade 1 diast dysfxn, critical AS, mild AI, AVA 0.6 cm2, mean gradient across AV 73 mmHg, mild MR, mild LAE, mild elevated PASP.  Marland Kitchen Musculoskeletal chest pain   . Dementia   . Hypothyroidism   . HLD (hyperlipidemia)   . GI bleed     while on coumadin  . Diverticulosis     severe in the sigmond colon  . CAD (coronary artery disease)     LHC 11/2000: EF 60%, pLAD 25%, dLAD 20%, D1 30%, mCFX 30%, pOM2 50% and lateral branch 60%, pRCA 20%.    Related Meds:     . aspirin EC  81 mg Oral Daily  . atorvastatin  20 mg Oral QHS  . ceFAZolin      . ceFAZolin  1 g Intravenous Once  . donepezil  10 mg Oral QHS  . ferrous sulfate  325 mg Oral Q breakfast  . furosemide  40 mg Intravenous Once  . furosemide  40 mg Oral Daily  . labetalol  100 mg Oral BID  . leflunomide  20 mg Oral Daily  . levothyroxine  100 mcg Oral Daily  . montelukast  10 mg Oral QHS  . pantoprazole  40 mg Oral Daily  . predniSONE  7.5 mg Oral Q breakfast  . sodium chloride  3 mL Intravenous Q12H  . vitamin C  500 mg Oral Daily  . DISCONTD: aspirin  81 mg Oral Daily  . DISCONTD: labetalol  100 mg Oral BID  . DISCONTD: predniSONE  7.5 mg Oral Daily  . DISCONTD: sodium chloride  3 mL Intravenous Q12H     Ht: 5\' 3"  (160 cm)  Wt: 152 lb 12.5 oz (69.3 kg)  Ideal Wt: 52.3 kg  % Ideal Wt:  133%  Usual Wt:  Wt Readings from Last 10 Encounters:  11/16/11 152 lb 12.5 oz (69.3 kg)  11/16/11 152 lb 12.5 oz (69.3 kg)  11/02/11 162 lb 6.4 oz (73.664 kg)  10/20/11 169 lb 6.4 oz (76.839 kg)  06/03/11 178 lb (80.74 kg)  04/20/11 179 lb 1.9 oz (81.248 kg)  03/24/11 167 lb 15.9 oz (76.2 kg)  03/02/11 179 lb (81.194 kg)  03/10/10 185 lb (83.915 kg)  11/24/09 182 lb (82.555 kg)  UBW-179 lbs   % Usual Wt: 85%  Body mass index is 27.06 kg/(m^2). Pt is overweight   Food/Nutrition Related Hx: Pt reports normal appetite and denies any problems with chewing or swallowing. Nutrition Risk report states unintentional weight loss and dysphagia.   Labs:  CMP     Component Value Date/Time   NA 143 11/17/2011 0508   K 3.8 11/17/2011 0508   CL 103 11/17/2011 0508   CO2 29 11/17/2011 0508   GLUCOSE 103* 11/17/2011 0508   BUN 17 11/17/2011 5621  CREATININE 1.23* 11/17/2011 0508   CALCIUM 9.6 11/17/2011 0508   PROT 6.3 03/22/2011 1935   ALBUMIN 3.1* 03/22/2011 1935   AST 19 03/22/2011 1935   ALT 14 03/22/2011 1935   ALKPHOS 61 03/22/2011 1935   BILITOT 0.1* 03/22/2011 1935   GFRNONAA 37* 11/17/2011 0508   GFRAA 43* 11/17/2011 0508    Intake/Output Summary (Last 24 hours) at 11/17/11 1022 Last data filed at 11/17/11 0800  Gross per 24 hour  Intake    680 ml  Output    311 ml  Net    369 ml      Diet Order: Cardiac 2gm sodium  Supplements/Tube Feeding: none  IVF:    sodium chloride   DISCONTD: sodium chloride Last Rate: 50 mL/hr at 11/16/11 1478  DISCONTD: sodium chloride Last Rate: 1 mL/kg/hr (11/16/11 1000)    Estimated Nutritional Needs:   Kcal: 1300-1500 Protein: 60-70 gm  Fluid:  > 1.5 L   Per weight hx pt has had 27 lb weight loss (15%) in about 5 months, severe weight loss. Denies drinking any supplements PTA. Pt with hx of dementia, unsure of reliability of pt's reports. Pt lives at home with her daughter who provides her meals, also has a caregiver, per pt report.   Spoke with pt's daughter over the phone. Pt has been losing weight for about 2 years, tends to chew her food and then spit it out. Was "tested" and does not have any problems with her swallowing, thinks it is a behavioral issue. Pt's weight was over 200 lbs before this started. In the past was drinking Boost, but daughter stopped giving it to her because she does not handle milk products well. Educated daughter that Boost is a lactose free product. Pt's daughter also repots pt has ongoing diarrhea for a long time that is not helped with Imodium. RD will notify pt's RN.  Pt's daughter is willing to have pt back on Boost while remains here to help maintain weight.   NUTRITION DIAGNOSIS: -Inadequate oral intake (NI-2.1).  Status: Ongoing  RELATED TO: behaviors/dementia  AS EVIDENCE BY: Pt's daughter report of pt chewing food and spitting it back out  MONITORING/EVALUATION(Goals): Goal: Pt will have >50% intake of meals and supplements Monitor: PO intake, weight, supplement tolerance  EDUCATION NEEDS: -No education needs identified at this time  INTERVENTION: 1. Will add Ensure Complete BID with meals  2. RD will notify RN of behaviors and ongoing diarrhea per daughter report 3. RD will continue to follow    DOCUMENTATION CODES Per approved criteria  -Not Applicable    Clarene Duke RD, LDN Pager [phone]

## 2011-11-17 NOTE — Progress Notes (Signed)
Pt transferred to floor via wheelchair to 4700. VSS, pt alert and oriented but disoriented to date. Demonstrated to patient how to use call bell when needing assistance from the nurse.  Will continue to monitor

## 2011-11-17 NOTE — Care Management Note (Signed)
    Page 1 of 1   11/17/2011     11:37:58 AM   CARE MANAGEMENT NOTE 11/17/2011  Patient:  [name], [name]   Account Number:  [id number]  Date Initiated:  11/17/2011  Documentation initiated by:  Junius Creamer  Subjective/Objective Assessment:   adm w aortic stenosis, had cath     Action/Plan:   lives w da, [facility] w [facility] program, have called [facility] to see if hx w hhc if needed. await phy ther  eval, pcp dr Margaretmary Bayley   Anticipated DC Date:     Anticipated DC Plan:        DC Planning Services  CM consult      Choice offered to / List presented to:             Status of service:   Medicare Important Message given?   (If response is "NO", the following Medicare IM given date fields will be blank) Date Medicare IM given:   Date Additional Medicare IM given:    Discharge Disposition:    Per UR Regulation:  Reviewed for med. necessity/level of care/duration of stay  If discussed at Long Length of Stay Meetings, dates discussed:    Comments:  6/27 11:36a debbie Carr Shartzer rn,bsn [phone]

## 2011-11-17 NOTE — Progress Notes (Signed)
    Subjective:  No chest pain or dyspnea. No complaints this am, other than some soreness at groin site.  Objective:  Vital Signs in the last 24 hours: Temp:  [98.5 F (36.9 C)-99.2 F (37.3 C)] 98.9 F (37.2 C) (06/27 0735) Pulse Rate:  [62-84] 77  (06/27 0735) Resp:  [15-26] 26  (06/27 0735) BP: (127-187)/(38-97) 129/47 mmHg (06/27 0735) SpO2:  [98 %-100 %] 100 % (06/27 0735) FiO2 (%):  [2 %] 2 % (06/27 0400) Weight:  [69.3 kg (152 lb 12.5 oz)] 69.3 kg (152 lb 12.5 oz) (06/26 1625)  Intake/Output from previous day: 06/26 0701 - 06/27 0700 In: 480 [P.O.:480] Out: 311 [Urine:310; Stool:1]  Physical Exam: Pt is alert, elderly woman in NAD HEENT: normal Neck: JVP - normal, carotids 2+= with  bruits Lungs: CTA bilaterally CV: RRR with 4/6 harsh systolic murmur at the LSB Abd: soft, NT, Positive BS, no hepatomegaly Ext: no C/C/E, distal pulses intact and equal. Right groin site is clear Skin: warm/dry no rash   Lab Results:  [facility] 11/17/11 0508  WBC 8.1  HGB 9.5*  PLT 163    Basename 11/17/11 0508  NA 143  K 3.8  CL 103  CO2 29  GLUCOSE 103*  BUN 17  CREATININE 1.23*   No results found for this basename: TROPONINI:2,CK,MB:2 in the last 72 hours  Cardiac Studies: Study Conclusions  - Left ventricle: The cavity size was normal. Wall thickness was increased in a pattern of moderate to severe LVH. There was focal basal hypertrophy of the septum. Systolic function was normal. The estimated ejection fraction was in the range of 60% to 65%. Wall motion was normal; there were no regional wall motion abnormalities. Doppler parameters are consistent with abnormal left ventricular relaxation (grade 1 diastolic dysfunction). - Aortic valve: There was severe stenosis. Mild to moderate regurgitation. Valve area: 0.69cm^2(VTI). Valve area: 0.76cm^2 (Vmax). - Mitral valve: Calcified annulus. Mild regurgitation. - Left atrium: The atrium was moderately dilated. -  Impressions: The transaortic valve gradient is decreased approxamtely 20-72mm HG after BAV. However the resdiual gradient remains in the severe range. There is mild to moderate AI.  Tele: sinus rhythm  Assessment/Plan:  1. Critical AS s/p successful balloon valvuloplasty. Pt stable without complications from procedure. Follow-up echo in 3-6 months depending on her clinical condition.  2. Diastolic CHF secondary to valvular heart disease. Diuresed with IV lasix yesterday, improved today.  3. General deconditioning - PT/OT assessment today. Consider discharge tomorrow if patient is to return home with her daughter.  Tonny Bollman, M.D. 11/17/2011, 7:48 AM

## 2011-11-18 DIAGNOSIS — I359 Nonrheumatic aortic valve disorder, unspecified: Secondary | ICD-10-CM

## 2011-11-18 NOTE — Progress Notes (Signed)
Cosign for Alan Tripp RN on assessment and meds.  

## 2011-11-18 NOTE — Progress Notes (Signed)
   CARE MANAGEMENT NOTE 11/18/2011  Patient:  [name], [name]   Account Number:  [id number]  Date Initiated:  11/17/2011  Documentation initiated by:  Junius Creamer  Subjective/Objective Assessment:   adm w aortic stenosis, had cath     Action/Plan:   lives w da, [facility] w [facility] program, have called [facility] to see if hx w hhc if needed. await phy ther  eval, pcp dr Margaretmary Bayley   Anticipated DC Date:  11/18/2011   Anticipated DC Plan:  HOME W HOME HEALTH SERVICES      DC Planning Services  CM consult      [facility] Choice  HOME HEALTH   Choice offered to / List presented to:          [facility] arranged  HH-9 [facility] PROGRAM  HH-2 PT  HH-1 RN      [facility] agency  [facility]   Status of service:  In process, will continue to follow Medicare Important Message given?   (If response is "NO", the following Medicare IM given date fields will be blank) Date Medicare IM given:   Date Additional Medicare IM given:    Discharge Disposition:    Per UR Regulation:  Reviewed for med. necessity/level of care/duration of stay  If discussed at Long Length of Stay Meetings, dates discussed:    Comments:  11/18/11 1654 TC to daughter, Janie Morning [phone]) to speak about [facility] services.  [name] stated that the pt. had used [facility] in past and was satisfied with their care.  SOC to begin within 24-48hr post discharge.  Gave contact information to [place] for [facility].  TC to Hilda Lias, with [facility], to give referral for [facility] PT, HH aide, [facility] RN.  Physician please complete the [facility] Order with the Face to Face completed for Medicare requirements.  Will leave handoff for weekend NCM to assist with discharge for tomorrow. Tera Mater, RN, BSN NCM [phone]    6/27 11:36a debbie dowell rn,bsn [phone] sw saw pt. await phy ther eval. pt's [facility] and her caps cm is susan dickens [phone]. hx of [facility] for [facility]hc if needed.

## 2011-11-18 NOTE — Progress Notes (Signed)
Physical Therapy Treatment Patient Details Name: [name] MRN: [id number] DOB: 1917-10-02 Today's Date: 11/18/2011 Time: 4098-1191 PT Time Calculation (min): 15 min  PT Assessment / Plan / Recommendation Comments on Treatment Session  Patient fatigued after short distance ambulation.  May need HHPT versus SNF depending on if family can provide 24/7 assist for mobility.      Follow Up Recommendations  Home health PT;Skilled nursing facility    Barriers to Discharge        Equipment Recommendations  None recommended by PT    Recommendations for Other Services    Frequency Min 3X/week   Plan Discharge plan remains appropriate    Precautions / Restrictions Precautions Precautions: Fall Precaution Comments: decreased vision   Pertinent Vitals/Pain Denies pain; HR 72, SpO2 100% after ambulation    Mobility  Transfers Transfers: Sit to Stand;Stand to Sit Sit to Stand: From chair/3-in-1;4: Min assist;With upper extremity assist Stand to Sit: 4: Min assist;With armrests;To chair/3-in-1 Ambulation/Gait Ambulation/Gait Assistance: 4: Min assist Ambulation Distance (Feet): 90 Feet (with seated rest after about 55') Assistive device: Rolling walker Ambulation/Gait Assistance Details: assist to maneuver around obstacles, cues for direction Gait Pattern: Step-through pattern;Decreased stride length;Trunk flexed    Exercises     PT Diagnosis:    PT Problem List:   PT Treatment Interventions:     PT Goals Acute Rehab PT Goals Pt will go Sit to Stand: with supervision;with upper extremity assist PT Goal: Sit to Stand - Progress: Progressing toward goal Pt will go Stand to Sit: with supervision;with upper extremity assist PT Goal: Stand to Sit - Progress: Progressing toward goal Pt will Ambulate: >150 feet;with supervision PT Goal: Ambulate - Progress: Progressing toward goal  Visit Information  Last PT Received On: 11/18/11    Subjective Data  Subjective: Arms give out  when I walk.   Cognition  Overall Cognitive Status: History of cognitive impairments - at baseline Arousal/Alertness: Awake/alert Orientation Level: Appears intact for tasks assessed Behavior During Session: [facility] for tasks performed    Balance     End of Session PT - End of Session Equipment Utilized During Treatment: Gait belt Activity Tolerance: Patient limited by fatigue Patient left: in chair;with call bell/phone within reach;with chair alarm set   GP     [facility] 11/18/2011, 5:35 PM

## 2011-11-18 NOTE — Progress Notes (Signed)
CSW spoke with patient's daughter [name] [phone]) and pt's daughter is OK with patient returning home vs. SNF. Patient's daughter would like Home Health set up. CSW discussed this with Dr. Excell Seltzer. He was agreeable to this discharge plan as well.  CSW contacted Ivonne Andrew CM to set  Up home Health.  Clinical Social Worker will sign off for now as social work intervention is no longer needed. Please consult [place] again if new need arises.   Sabino Niemann, MSW, [employer] [phone]

## 2011-11-18 NOTE — Progress Notes (Signed)
    Subjective:  No complaints today. Feels ready to go home. Denies chest pain or dyspnea.  Objective:  Vital Signs in the last 24 hours: Temp:  [98.2 F (36.8 C)-99.1 F (37.3 C)] 98.5 F (36.9 C) (06/28 1610) Pulse Rate:  [68-86] 68  (06/28 0942) Resp:  [18-22] 18  (06/28 0614) BP: (126-165)/(45-66) 126/50 mmHg (06/28 0942) SpO2:  [95 %-99 %] 98 % (06/28 0614) Weight:  [69.5 kg (153 lb 3.5 oz)-69.8 kg (153 lb 14.1 oz)] 69.5 kg (153 lb 3.5 oz) (06/28 9604)  Intake/Output from previous day: 06/27 0701 - 06/28 0700 In: 880 [P.O.:877; I.V.:3] Out: 326 [Urine:325; Stool:1]  Physical Exam: Pt is alert, elderly woman, NAD HEENT: normal Neck: JVP - normal Lungs: CTA bilaterally CV: RRR with grade 4/6 systolic murmur at the LSB Abd: soft, NT, Positive BS, no hepatomegaly Ext: no C/C/E, distal pulses intact and equal Skin: warm/dry no rash   Lab Results:  Basename 11/17/11 0508  WBC 8.1  HGB 9.5*  PLT 163    Basename 11/17/11 0508  NA 143  K 3.8  CL 103  CO2 29  GLUCOSE 103*  BUN 17  CREATININE 1.23*   No results found for this basename: TROPONINI:2,CK,MB:2 in the last 72 hours  Tele: sinus rhythm no arrhythmia. Personally reviewed.  Assessment/Plan:  1. Severe AS - s/p valvuloplasty. Tolerated well. Reduced gradient noted by echo. Seems medically stable for discharge.  2. Diastolic CHF - stable. Continue current medical therapy. BP is controlled.  3. Dementia - some degree of hospital-related disorientation, but overall stable.   4. Dispo - I called daughter and left message. If she is to return home I think she is stable for discharge with home health. If to go to [facility] we will need to arrange that. Will wait to hear back form her daughter.   Tonny Bollman, M.D. 11/18/2011, 1:00 PM

## 2011-11-19 DIAGNOSIS — I5032 Chronic diastolic (congestive) heart failure: Secondary | ICD-10-CM

## 2011-11-19 NOTE — Progress Notes (Signed)
Patient ID: [name], female   DOB: 1917-11-09, 78 y.o.   MRN: [id number]   Patient Name: [name] Date of Encounter: 11/19/2011    SUBJECTIVE  Walking with assist without SOB. Wants to go home. Daughter has agreed to take her home.  CURRENT MEDS    . aspirin EC  81 mg Oral Daily  . atorvastatin  20 mg Oral QHS  . donepezil  10 mg Oral QHS  . feeding supplement  237 mL Oral BID WC  . ferrous sulfate  325 mg Oral Q breakfast  . furosemide  40 mg Oral Daily  . labetalol  100 mg Oral BID  . leflunomide  20 mg Oral Daily  . levothyroxine  100 mcg Oral Daily  . montelukast  10 mg Oral QHS  . pantoprazole  40 mg Oral Daily  . predniSONE  7.5 mg Oral Q breakfast  . sodium chloride  3 mL Intravenous Q12H  . vitamin C  500 mg Oral Daily    OBJECTIVE  Filed Vitals:   11/18/11 1418 11/18/11 1515 11/18/11 2221 11/19/11 0453  BP: 129/61  147/49 133/59  Pulse: 83 72 75 78  Temp: 97.9 F (36.6 C)  97.9 F (36.6 C) 98.4 F (36.9 C)  TempSrc: Oral  Oral Oral  Resp: 18  20 18   Height:      Weight:    152 lb 12.8 oz (69.31 kg)  SpO2: 100% 100% 98% 99%    Intake/Output Summary (Last 24 hours) at 11/19/11 1258 Last data filed at 11/19/11 0713  Gross per 24 hour  Intake    530 ml  Output    375 ml  Net    155 ml   Filed Weights   11/17/11 1417 11/18/11 0614 11/19/11 0453  Weight: 153 lb 14.1 oz (69.8 kg) 153 lb 3.5 oz (69.5 kg) 152 lb 12.8 oz (69.31 kg)    PHYSICAL EXAM  General: Pleasant, NAD. Neuro: Alert and oriented X 3. Moves all extremities spontaneously. Psych: Normal affect. HEENT:  Normal  Neck: Supple without bruits or JVD. Lungs:  Resp regular and unlabored, CTA. Heart: RRR  Abdomen: Soft, non-tender, non-distended, BS + x 4.  Extremities: No clubbing, cyanosis or edema. DP/PT/Radials 2+ and equal bilaterally.  Accessory Clinical Findings  CBC  Basename 11/17/11 0508  WBC 8.1  NEUTROABS --  HGB 9.5*  HCT 30.3*  MCV 87.1  PLT 163    Basic Metabolic Panel  Basename 11/17/11 0508  NA 143  K 3.8  CL 103  CO2 29  GLUCOSE 103*  BUN 17  CREATININE 1.23*  CALCIUM 9.6  MG --  PHOS --   Liver Function Tests No results found for this basename: AST:2,ALT:2,ALKPHOS:2,BILITOT:2,PROT:2,ALBUMIN:2 in the last 72 hours No results found for this basename: LIPASE:2,AMYLASE:2 in the last 72 hours Cardiac Enzymes No results found for this basename: CKTOTAL:3,CKMB:3,CKMBINDEX:3,TROPONINI:3 in the last 72 hours BNP No components found with this basename: POCBNP:3 D-Dimer No results found for this basename: DDIMER:2 in the last 72 hours Hemoglobin A1C No results found for this basename: HGBA1C in the last 72 hours Fasting Lipid Panel No results found for this basename: CHOL,HDL,LDLCALC,TRIG,CHOLHDL,LDLDIRECT in the last 72 hours Thyroid Function Tests No results found for this basename: TSH,T4TOTAL,FREET3,T3FREE,THYROIDAB in the last 72 hours  TELE NSR  ECG    Radiology/Studies  Dg Abd 1 View  10/26/2011  *RADIOLOGY REPORT*  Clinical Data: Diarrhea, assess bowel gas pattern  ABDOMEN - 1 VIEW  Comparison: 07/31/2009  Findings:  IVC filter projects at the L4-L5 level. Scattered atherosclerotic calcifications. Gas identified within stomach and upper normal diameter transverse colon. Nonobstructive bowel gas pattern. No bowel dilatation, definite bowel Yair Dusza thickening, or free intraperitoneal air. Left upper quadrant calcification stable, by prior CT appears gastric in origin. Degenerative disc and facet disease changes lumbar spine.  Advanced osteoarthritic changes of the right hip with acetabular protrusio. Mild degenerative changes left hip joint. No definite urinary tract calcification. Scattered pelvic phleboliths. Surgical clips right upper quadrant question cholecystectomy.  IMPRESSION: Nonobstructive bowel gas pattern.  Original Report Authenticated By: Lollie Marrow, M.D.   Dg Chest Port 1 View  11/16/2011  *RADIOLOGY  REPORT*  Clinical Data: Evaluate for pulmonary edema.  PORTABLE CHEST - 1 VIEW  Comparison: Chest x-ray 03/23/2011.  Findings: Lung volumes are normal.  No consolidative airspace disease.  No pleural effusions.  There is very mild pulmonary venous congestion without frank pulmonary edema.  Heart size is mild - moderately enlarged.  The patient is rotated to the right on today's exam, resulting in distortion of the mediastinal contours and reduced diagnostic sensitivity and specificity for mediastinal pathology.  Atherosclerotic calcifications are noted within the arch of the aorta.  Surgical clips project over the right upper quadrant of the abdomen, consistent with prior cholecystectomy. What appears to represent an IVC filter is also seen immediately lateral to the L2-L3 interspace.  IMPRESSION: 1.  Mild - moderate cardiomegaly with pulmonary venous congestion, but no frank pulmonary edema. 2.  Atherosclerosis. 3.  Status post cholecystectomy. 4.  IVC filter in place, as above.  Original Report Authenticated By: Florencia Reasons, M.D.    ASSESSMENT AND PLAN  Stable and ready for discharge. Will contact daughter to pick up this afternoon. Home Health arranged.    Signed, Valera Castle MD

## 2011-11-19 NOTE — Discharge Summary (Signed)
Physician Discharge Summary  Patient ID: [name] MRN: [id number] DOB/AGE: 1918/04/09 78 y.o.  Admit date: 11/16/2011 Discharge date: 11/19/2011  Primary Discharge Diagnosis:  1. Severe Aortic Stenosis s/p Balloon Valvuloplasty   Secondary Discharge Diagnosis 1. Diastolic CHF 2. Dementia with General Deconditioning 3. PE s/p IVC filter   Significant Diagnostic Studies: 1.Temporary Pacemaker implantation 2. Cardiac Catheterization 11/16/2011 Coronary angiography:  Coronary dominance: right  Left mainstem: Widely patent, heavily calcified.  Left anterior descending (LAD): The LAD is patent to the left ventricular apex. The vessel wraps around the apex. There is moderate calcification with mild diffuse nonobstructive disease present. There are 2 diagonals without significant stenosis.  Left circumflex (LCx): The left circumflex is a large vessel, there are no significant stenoses present. The obtuse marginal branch divides into twin vessels without significant stenosis.  Right coronary artery (RCA): The right coronary artery is codominant with the circumflex. The ostium is heavily calcified with 50% stenosis. There is mild diffuse nonobstructive disease throughout  Left ventriculography: Left ventricular function is vigorous. The ejection fraction is estimated at 65%. The aortic valve is heavily calcified. Mitral annulus is heavily calcified. There is 2-3+ mitral regurgitation  Final Conclusions:  1. Mild diffuse nonobstructive coronary artery disease  2. Normal left ventricular systolic function with moderate mitral regurgitation  3. Severe aortic stenosis with uncomplicated balloon aortic valvuloplasty with modest reduction in transaortic valve gradient   3. Echocardiogram  11/16/2011 - Left ventricle: The cavity size was normal. Duan Scharnhorst thickness was increased in a pattern of moderate to severe LVH. There was focal basal hypertrophy of the septum. Systolic function was normal. The  estimated ejection fraction was in the range of 60% to 65%. Sissi Padia motion was normal; there were no regional Arthur Aydelotte motion abnormalities. Doppler parameters are consistent with abnormal left ventricular relaxation (grade 1 diastolic dysfunction). - Aortic valve: There was severe stenosis. Mild to moderate regurgitation. Valve area: 0.69cm^2(VTI). Valve area: 0.76cm^2 (Vmax). - Mitral valve: Calcified annulus. Mild regurgitation. - Left atrium: The atrium was moderately dilated. - Impressions: The transaortic valve gradient is decreased approxamtely 20-66mm HG after BAV. However the resdiual gradient remains in the severe range. There is mild to moderate AI. Impressions:  The transaortic valve gradient is decreased approxamtely 20-64mm HG after BAV. However the resdiual gradient remains in the severe range. There is mild to moderate AI.  Consults: Physical Therapy  Hospital Course: [name] is a 78 y/o patient of Dr. Sherryl Manges, and most recently Dr. Tonny Bollman. She was seen by him for evaluation of severe aortic valve stenosis. She had a history of severe shortness of breath and chest pressure with activity which was insidious. Symptoms progressed with associated pressure in her chest was very low-level activity. She was seen by Dr. Excell Seltzer in the office on 11/02/2011 with discussion of need for cardiac catheterization and possible valvuloplasty of her aortic valve. She was subsequently admitted on 11/16/2011 for procedure along with cardiac catheterization as discussed above. She had a successful and uncomplicated balloon aortic valvuloplasty per Dr. Excell Seltzer with subsequent echocardiogram following. Her transaortic valve gradient decreased 2 20-25 mmHg. after BAV. There was some residual gradient remaining in the severe range. She was also found to have some mild to moderate AI.    The patient recovered well but remained mildly confused post procedure. She was seen by social work during  hospitalization and home health nurses have been set up to follow her as an outpatient. She is going to live with her daughter  on discharge. She was seen by physical therapy secondary to significant deconditioning. She will have home PT as well.    She was seen and examined by Dr. Elijah Birk Nollan Muldrow prior to discharge. The patient was anxious to return home. She will continue current medication regimen and she was taking prior to admission. She was up walking in the room with assistance without any significant shortness of breath. She will followup with Dr. Graciela Husbands and Dr. Excell Seltzer as an outpatient post discharge.   Discharge Exam: Blood pressure 133/59, pulse 78, temperature 98.4 F (36.9 C), temperature source Oral, resp. rate 18, height 5\' 1"  (1.549 m), weight 152 lb 12.8 oz (69.31 kg), SpO2 99.00%.  Labs:   Lab Results  Component Value Date   WBC 8.1 11/17/2011   HGB 9.5* 11/17/2011   HCT 30.3* 11/17/2011   MCV 87.1 11/17/2011   PLT 163 11/17/2011     Lab 11/17/11 0508  NA 143  K 3.8  CL 103  CO2 29  BUN 17  CREATININE 1.23*  CALCIUM 9.6  PROT --  BILITOT --  ALKPHOS --  ALT --  AST --  GLUCOSE 103*   Lab Results  Component Value Date   CKTOTAL 115 03/23/2011   CKMB 2.2 03/23/2011   TROPONINI <0.30 03/23/2011         Radiology: Dg Abd 1 View  10/26/2011  *RADIOLOGY REPORT*  Clinical Data: Diarrhea, assess bowel gas pattern  ABDOMEN - 1 VIEW  Comparison: 07/31/2009  Findings: IVC filter projects at the L4-L5 level. Scattered atherosclerotic calcifications. Gas identified within stomach and upper normal diameter transverse colon. Nonobstructive bowel gas pattern. No bowel dilatation, definite bowel Elaine Roanhorse thickening, or free intraperitoneal air. Left upper quadrant calcification stable, by prior CT appears gastric in origin. Degenerative disc and facet disease changes lumbar spine.  Advanced osteoarthritic changes of the right hip with acetabular protrusio. Mild degenerative changes left hip  joint. No definite urinary tract calcification. Scattered pelvic phleboliths. Surgical clips right upper quadrant question cholecystectomy.  IMPRESSION: Nonobstructive bowel gas pattern.  Original Report Authenticated By: Lollie Marrow, M.D.   Dg Chest Port 1 View  11/16/2011  *RADIOLOGY REPORT*  Clinical Data: Evaluate for pulmonary edema.  PORTABLE CHEST - 1 VIEW  Comparison: Chest x-ray 03/23/2011.  Findings: Lung volumes are normal.  No consolidative airspace disease.  No pleural effusions.  There is very mild pulmonary venous congestion without frank pulmonary edema.  Heart size is mild - moderately enlarged.  The patient is rotated to the right on today's exam, resulting in distortion of the mediastinal contours and reduced diagnostic sensitivity and specificity for mediastinal pathology.  Atherosclerotic calcifications are noted within the arch of the aorta.  Surgical clips project over the right upper quadrant of the abdomen, consistent with prior cholecystectomy. What appears to represent an IVC filter is also seen immediately lateral to the L2-L3 interspace.  IMPRESSION: 1.  Mild - moderate cardiomegaly with pulmonary venous congestion, but no frank pulmonary edema. 2.  Atherosclerosis. 3.  Status post cholecystectomy. 4.  IVC filter in place, as above.  Original Report Authenticated By: Florencia Reasons, M.D.    WUJ:WJXBJ Rhythm with PAC's RBBB.  FOLLOW UP PLANS AND APPOINTMENTS Discharge Orders    Future Orders Please Complete By Expires   Diet - low sodium heart healthy      Increase activity slowly        Medication List  As of 11/19/2011  1:34 PM   TAKE these medications  acetaminophen 500 MG chewable tablet   Commonly known as: TYLENOL   Chew 500-1,000 mg by mouth every 6 (six) hours as needed. For pain      aspirin 81 MG tablet   Take 81 mg by mouth daily.      atorvastatin 20 MG tablet   Commonly known as: LIPITOR   Take 20 mg by mouth at bedtime.      donepezil  10 MG tablet   Commonly known as: ARICEPT   Take 10 mg by mouth at bedtime.      ergocalciferol 50000 UNITS capsule   Commonly known as: VITAMIN D2   Take 50,000 Units by mouth once a week. Take on sunday      esomeprazole 40 MG capsule   Commonly known as: NEXIUM   Take 40 mg by mouth daily before breakfast.      ferrous sulfate 325 (65 FE) MG tablet   Take 325 mg by mouth daily with breakfast.      furosemide 40 MG tablet   Commonly known as: LASIX   Take 1 tablet (40 mg total) by mouth daily.      labetalol 200 MG tablet   Commonly known as: NORMODYNE   Take 100 mg by mouth 2 (two) times daily.      leflunomide 20 MG tablet   Commonly known as: ARAVA   Take 20 mg by mouth daily.      levothyroxine 100 MCG tablet   Commonly known as: SYNTHROID, LEVOTHROID   Take 100 mcg by mouth daily.      loperamide 2 MG capsule   Commonly known as: IMODIUM   Take 2 mg by mouth as needed. For diarrhea      montelukast 10 MG tablet   Commonly known as: SINGULAIR   Take 10 mg by mouth at bedtime.      NON FORMULARY   Take 15 mLs by mouth daily. Pottasium liquid  1 tablspoon daily      predniSONE 5 MG tablet   Commonly known as: DELTASONE   Take 7.5 mg by mouth daily.      vitamin C 500 MG tablet   Commonly known as: ASCORBIC ACID   Take 500 mg by mouth daily.           Follow-up Information    Follow up with Tonny Bollman, MD. (Our office will call you  for appointment. )    Contact information:   [address] [place] [address] [phone]            Time spent with patient to include physician time:40 minutes Signed: Joni Reining 11/19/2011, 1:34 PM Co-Sign MD Jesse Sans. Daleen Squibb, MD, [facility] Pager:  [phone]

## 2011-11-21 DIAGNOSIS — I359 Nonrheumatic aortic valve disorder, unspecified: Secondary | ICD-10-CM

## 2011-11-30 ENCOUNTER — Institutional Professional Consult (permissible substitution): Payer: Medicare Other | Admitting: Cardiovascular Disease

## 2011-12-01 ENCOUNTER — Telehealth: Payer: Self-pay | Admitting: Cardiovascular Disease

## 2011-12-01 NOTE — Telephone Encounter (Signed)
New problem:  [name] patient daughter calling.  Would like nurse to request additional information from [facility]  Regarding B/p oxygen, weight. Pt /ot notes. C/O high blood pressure 190/54, 147/ ?Marland Kitchen

## 2011-12-01 NOTE — Telephone Encounter (Signed)
Left message to call back.  I did obtain records per the daughter's request.  The pt is scheduled to see Dr Excell Seltzer on 12/02/11 and we can address further issues at that time.

## 2011-12-02 ENCOUNTER — Encounter: Payer: Self-pay | Admitting: Cardiovascular Disease

## 2011-12-02 ENCOUNTER — Ambulatory Visit (INDEPENDENT_AMBULATORY_CARE_PROVIDER_SITE_OTHER): Payer: Medicare Other | Admitting: Cardiovascular Disease

## 2011-12-02 VITALS — BP 110/64 | HR 71 | Ht 63.0 in | Wt 160.0 lb

## 2011-12-02 DIAGNOSIS — I359 Nonrheumatic aortic valve disorder, unspecified: Secondary | ICD-10-CM

## 2011-12-02 MED ORDER — LABETALOL HCL 200 MG PO TABS: 200.0000 mg | ORAL_TABLET | Freq: Two times a day (BID) | ORAL | Status: AC

## 2011-12-02 NOTE — Progress Notes (Signed)
HPI:  78 year old woman presenting for followup of severe aortic stenosis. The patient was recently hospitalized for palliative balloon aortic valvuloplasty for treatment of critical aortic stenosis in the setting of class IV heart failure. Her procedure was uncomplicated. Post valvuloplasty, her mean transaortic valve gradient was reduced by approximately 25 mm mercury, but remained in the severe AS range. She presents today for followup evaluation.  Has been stable since her procedure. She comes in today with her daughter and her caregiver. They have really not appreciated any significant improvement since the procedure was done. She still has dyspnea with low-level activity. She has no resting dyspnea or chest pain. She has not had syncope. She sleeps much of the time. The patient has no complaints. She denies any chest pain, shortness of breath or other problems.  Home blood pressures have been somewhat erratic. She has had episodes of significant hypertension with blood pressure up to 190/110. Other blood pressures have been within normal limits.  Outpatient Encounter Prescriptions as of 12/02/2011  Medication Sig Dispense Refill  . acetaminophen (TYLENOL) 500 MG chewable tablet Chew 500-1,000 mg by mouth every 6 (six) hours as needed. For pain      . aspirin 81 MG tablet Take 81 mg by mouth daily.      Marland Kitchen atorvastatin (LIPITOR) 20 MG tablet Take 20 mg by mouth at bedtime.       . buprenorphine (BUTRANS) 5 MCG/HR PTWK Place 5 mcg onto the skin once a week.      . donepezil (ARICEPT) 10 MG tablet Take 10 mg by mouth at bedtime.        . ergocalciferol (VITAMIN D2) 50000 UNITS capsule Take 50,000 Units by mouth once a week. Take on sunday      . esomeprazole (NEXIUM) 40 MG capsule Take 40 mg by mouth daily before breakfast.        . furosemide (LASIX) 40 MG tablet Take 1 tablet (40 mg total) by mouth daily.  30 tablet  6  . labetalol (NORMODYNE) 200 MG tablet Take 100 mg by mouth 2 (two) times  daily.       Marland Kitchen leflunomide (ARAVA) 20 MG tablet Take 20 mg by mouth daily.        Marland Kitchen levothyroxine (SYNTHROID, LEVOTHROID) 150 MCG tablet Take 150 mcg by mouth daily.      Marland Kitchen loperamide (IMODIUM) 2 MG capsule Take 2 mg by mouth as needed. For diarrhea      . montelukast (SINGULAIR) 10 MG tablet Take 10 mg by mouth at bedtime.      . NON FORMULARY Take 15 mLs by mouth daily. Pottasium liquid  1 tablspoon daily      . predniSONE (DELTASONE) 5 MG tablet Take 7.5 mg by mouth daily.       . vitamin C (ASCORBIC ACID) 500 MG tablet Take 500 mg by mouth daily.        Marland Kitchen DISCONTD: ferrous sulfate 325 (65 FE) MG tablet Take 325 mg by mouth daily with breakfast.      . DISCONTD: levothyroxine (SYNTHROID, LEVOTHROID) 100 MCG tablet Take 100 mcg by mouth daily.        Allergies  Allergen Reactions  . Vioxx (Rofecoxib) Nausea And Vomiting    Unknown     Past Medical History  Diagnosis Date  . Chronic diastolic heart failure   . Rheumatoid arthritis   . Ulcer of esophagus with bleeding   . Essential hypertension, benign   . Benign neoplasm  of colon   . Iron deficiency anemia secondary to blood loss (chronic)   . Syncope and collapse   . Pulmonary embolism     IVC filter  . Aortic stenosis     echo 4/11: mod LVH, mild focal basal septal hypertrophy, EF 55-60%, grade 1 diast dysfxn, critical AS, mild AI, AVA 0.6 cm2, mean gradient across AV 73 mmHg, mild MR, mild LAE, mild elevated PASP.  Marland Kitchen Musculoskeletal chest pain   . Dementia   . Hypothyroidism   . HLD (hyperlipidemia)   . GI bleed     while on coumadin  . Diverticulosis     severe in the sigmond colon  . CAD (coronary artery disease)     LHC 11/2000: EF 60%, pLAD 25%, dLAD 20%, D1 30%, mCFX 30%, pOM2 50% and lateral branch 60%, pRCA 20%.    ROS: Negative except as per HPI  BP 110/64  Pulse 71  Ht 5\' 3"  (1.6 m)  Wt 72.576 kg (160 lb)  BMI 28.34 kg/m2  SpO2 99%  PHYSICAL EXAM: Blood pressure on my check was 120/80 Pt is alert,  elderly woman, somnolent but easily arousable and interactive when she wakes up. She is in NAD HEENT: normal Neck: JVP - normal, carotids 2+= with bilateral bruits Lungs: CTA bilaterally CV: RRR with grade 4/6 harsh systolic murmur at the left sternal border with absent A2 Abd: soft, NT, Positive BS, no hepatomegaly Ext: 1+ pretibial edema bilaterally, distal pulses intact and equal Skin: warm/dry no rash  ASSESSMENT AND PLAN: 1. Critical aortic stenosis. No major change since balloon aortic valvuloplasty. She did have improvement in her transaortic velocities, but clinically she is really no different. I think her clinical decline is related to a combination of her cardiac disease as well as a significant contribution from dementia and advanced age. I would like to see her back in 3 months. She is not a candidate for further aortic valve interventional therapies and I explained this to her daughter and caregiver who both understand.  2. Essential hypertension. Her blood pressures were reviewed and I'm going to increase her labetalol to 200 mg twice daily.  Tonny Bollman 12/02/2011 9:45 AM

## 2011-12-02 NOTE — Patient Instructions (Addendum)
Your physician recommends that you schedule a follow-up appointment in: 3 MONTHS with Dr Excell Seltzer  Your physician has recommended you make the following change in your medication: INCREASE Labetalol to 200mg  take one by mouth twice a day

## 2011-12-02 NOTE — Telephone Encounter (Signed)
Pt being seen in the office today by Dr Excell Seltzer.

## 2011-12-28 ENCOUNTER — Telehealth: Payer: Self-pay | Admitting: Cardiovascular Disease

## 2011-12-28 NOTE — Telephone Encounter (Signed)
New problem:  Daughter [name] calling. C/O mother B/p in am before med's ranging from 160 to 188. Should B/p be increase.

## 2011-12-28 NOTE — Telephone Encounter (Signed)
I spoke with the pt's daughter and she said the pt's BP is elevated prior to her medications. I made her aware that the BP will be higher than normal when she is due for meds.  They do monitor her BP after she has taken her medications and she thinks it runs around 140. [name] keeps a record of the BP readings at home and the [facility] from [facility] also monitors the pt's BP.  [name] is not at home so she does not have the [facility] contact information or the BP readings.  She will call back with this information when she gets home.

## 2012-01-04 ENCOUNTER — Telehealth: Payer: Self-pay | Admitting: Cardiovascular Disease

## 2012-01-04 NOTE — Telephone Encounter (Signed)
New Problem:    Called because patients BP is all over the place (from 180/80 to 120/80) and believes that she needs to be seen.  High before she takes her BP medication, fine after, high again at night.  Please call back.

## 2012-01-04 NOTE — Telephone Encounter (Signed)
Please see new phone note that was started on 01/04/12.

## 2012-01-04 NOTE — Telephone Encounter (Signed)
I spoke with Beth the [employer] with [facility].  She called because the pt's BP is fluctuating.  She said the pt's BP is high prior to her medications and then it returns to normal after her medications. I made her aware that we would expect her BP to be higher when she is due to for medications.  The pt will continue current medications. I made Beth aware that if we increase the pt's medications then this can cause problems with low BP, dizziness and falls. At this time the [facility] will continue to monitor BP.

## 2012-02-29 ENCOUNTER — Ambulatory Visit: Payer: Medicare Other | Admitting: Cardiovascular Disease

## 2012-03-02 ENCOUNTER — Encounter: Payer: Self-pay | Admitting: Cardiovascular Disease

## 2012-03-02 ENCOUNTER — Ambulatory Visit (INDEPENDENT_AMBULATORY_CARE_PROVIDER_SITE_OTHER): Payer: Medicare Other | Admitting: Cardiovascular Disease

## 2012-03-02 VITALS — BP 160/59 | HR 73 | Wt 152.0 lb

## 2012-03-02 DIAGNOSIS — I359 Nonrheumatic aortic valve disorder, unspecified: Secondary | ICD-10-CM

## 2012-03-02 DIAGNOSIS — I350 Nonrheumatic aortic (valve) stenosis: Secondary | ICD-10-CM

## 2012-03-02 NOTE — Patient Instructions (Addendum)
Your physician recommends that you schedule a follow-up appointment as needed.   Your physician recommends that you continue on your current medications as directed. Please refer to the Current Medication list given to you today.  Will refer patient to Hospice.

## 2012-03-02 NOTE — Progress Notes (Signed)
HPI:  78 year old woman presenting for followup evaluation. She has critical aortic stenosis and has undergone palliative balloon aortic valvuloplasty in the setting of class IV congestive heart failure. She presents today for followup.  The patient continues to decline. Her daughter is taking care of her and is having a very difficult time. The patient has progressively more tired and sleepy much of the day. She's been trying to balance this with pain control and she has significant pain in her shoulder and legs. She's been on a fentanyl patch and tramadol as needed. She remained short of breath with activity. She's had no chest pain or pressure. Her leg swelling has been under control but is a problem when she eats salt. She's not been eating much at all.  Outpatient Encounter Prescriptions as of 03/02/2012  Medication Sig Dispense Refill  . acetaminophen (TYLENOL) 500 MG chewable tablet Chew 500-1,000 mg by mouth every 6 (six) hours as needed. For pain      . aspirin 81 MG tablet Take 81 mg by mouth daily.      Marland Kitchen atorvastatin (LIPITOR) 20 MG tablet Take 20 mg by mouth at bedtime.       Marland Kitchen esomeprazole (NEXIUM) 40 MG capsule Take 40 mg by mouth daily before breakfast.        . furosemide (LASIX) 40 MG tablet Take 1 tablet (40 mg total) by mouth daily.  30 tablet  6  . labetalol (NORMODYNE) 200 MG tablet Take 1 tablet (200 mg total) by mouth 2 (two) times daily.  60 tablet  11  . leflunomide (ARAVA) 20 MG tablet Take 20 mg by mouth daily.        Marland Kitchen levothyroxine (SYNTHROID, LEVOTHROID) 150 MCG tablet Take 150 mcg by mouth daily.      Marland Kitchen loperamide (IMODIUM) 2 MG capsule Take 2 mg by mouth as needed. For diarrhea      . montelukast (SINGULAIR) 10 MG tablet Take 10 mg by mouth at bedtime.      . NON FORMULARY Take 15 mLs by mouth daily. Pottasium liquid  1 tablspoon daily      . predniSONE (DELTASONE) 5 MG tablet Take 7.5 mg by mouth daily.       . TRAMADOL HCL ER PO Take 1 tablet by mouth as  needed.      . vitamin C (ASCORBIC ACID) 500 MG tablet Take 500 mg by mouth daily.        Marland Kitchen DISCONTD: buprenorphine (BUTRANS) 5 MCG/HR PTWK Place 5 mcg onto the skin once a week.      Marland Kitchen DISCONTD: donepezil (ARICEPT) 10 MG tablet Take 10 mg by mouth at bedtime.        Marland Kitchen DISCONTD: ergocalciferol (VITAMIN D2) 50000 UNITS capsule Take 50,000 Units by mouth once a week. Take on sunday        Allergies  Allergen Reactions  . Vioxx (Rofecoxib) Nausea And Vomiting    Unknown     Past Medical History  Diagnosis Date  . Chronic diastolic heart failure   . Rheumatoid arthritis   . Ulcer of esophagus with bleeding   . Essential hypertension, benign   . Benign neoplasm of colon   . Iron deficiency anemia secondary to blood loss (chronic)   . Syncope and collapse   . Pulmonary embolism     IVC filter  . Aortic stenosis     echo 4/11: mod LVH, mild focal basal septal hypertrophy, EF 55-60%, grade 1 diast dysfxn, critical  AS, mild AI, AVA 0.6 cm2, mean gradient across AV 73 mmHg, mild MR, mild LAE, mild elevated PASP.  Marland Kitchen Musculoskeletal chest pain   . Dementia   . Hypothyroidism   . HLD (hyperlipidemia)   . GI bleed     while on coumadin  . Diverticulosis     severe in the sigmond colon  . CAD (coronary artery disease)     LHC 11/2000: EF 60%, pLAD 25%, dLAD 20%, D1 30%, mCFX 30%, pOM2 50% and lateral branch 60%, pRCA 20%.    ROS: Negative except as per HPI  BP 160/59  Pulse 73  Wt 68.947 kg (152 lb)  PHYSICAL EXAM: Pt is somnolent but easily arousable. She answers questions appropriately but she is not oriented to time. She's in NAD HEENT: normal Neck: JVP - normal, carotids 2+=  Lungs: CTA bilaterally CV: RRR with a grade 4/6 crescendo decrescendo systolic murmur at the left sternal border Abd: soft, NT, Positive BS, no hepatomegaly Ext: Trace edema on the right, 1+ edema on the left., distal pulses intact and equal Skin: warm/dry no rash  EKG:    ASSESSMENT AND PLAN: 1.  Severe symptomatic aortic stenosis. The patient is not a candidate for any further vascular intervention at this time.  2. Chronic diastolic heart failure. She appears compensated and we will continue her current medications.  3. Hypertension. Blood pressure is elevated but not markedly so. Recommend same medications.  Overall, the patient is continuing to decline. Considering her advanced age and severe aortic stenosis, I think a hospice referral is appropriate and would provide a great deal of help to the patient and her family. They're very receptive to this and we will make the referral.  Tonny Bollman 03/02/2012 2:56 PM

## 2012-03-05 ENCOUNTER — Encounter: Payer: Self-pay | Admitting: Cardiovascular Disease

## 2012-03-23 ENCOUNTER — Telehealth: Payer: Self-pay | Admitting: Cardiovascular Disease

## 2012-03-23 NOTE — Telephone Encounter (Signed)
New Problem:    Called in wanting to speak with you about the patient.  Patient has SOB this week, leaning forward, O2 is 95% on room air, wanted to know how to proceed.  Please call back.

## 2012-03-23 NOTE — Telephone Encounter (Signed)
I spoke Toniann Fail who is with Hospice.  She said this is the second time she has seen the pt and wanted to make [place] aware of the pt's status.  I made her aware that this is the pt's baseline at this time.  We will also have oxygen placed in the home to be used as needed (2-4 L/min).

## 2012-03-28 ENCOUNTER — Telehealth: Payer: Self-pay | Admitting: Cardiovascular Disease

## 2012-03-28 NOTE — Telephone Encounter (Signed)
I spoke with [name] and they are requesting a nutrition consult to try and help identify ways to increase the pt's caloric intake.  I made [name] aware that this is okay to proceed with nutrition consult.

## 2012-03-28 NOTE — Telephone Encounter (Signed)
New Problem:    Called in requesting an order for a home nutrition consult.  Please call back.

## 2012-04-06 ENCOUNTER — Encounter (HOSPITAL_COMMUNITY): Payer: Self-pay | Admitting: *Deleted

## 2012-04-06 ENCOUNTER — Inpatient Hospital Stay (HOSPITAL_COMMUNITY)
Admission: EM | Admit: 2012-04-06 | Discharge: 2012-04-08 | DRG: 292 | Disposition: A | Discharge status: Home / Self Care | Payer: Medicare Other | Attending: Internal Medicine | Admitting: Internal Medicine

## 2012-04-06 ENCOUNTER — Emergency Department (HOSPITAL_COMMUNITY): Payer: Medicare Other

## 2012-04-06 DIAGNOSIS — E785 Hyperlipidemia, unspecified: Secondary | ICD-10-CM | POA: Diagnosis present

## 2012-04-06 DIAGNOSIS — Z79899 Other long term (current) drug therapy: Secondary | ICD-10-CM

## 2012-04-06 DIAGNOSIS — R55 Syncope and collapse: Secondary | ICD-10-CM

## 2012-04-06 DIAGNOSIS — F068 Other specified mental disorders due to known physiological condition: Secondary | ICD-10-CM

## 2012-04-06 DIAGNOSIS — M069 Rheumatoid arthritis, unspecified: Secondary | ICD-10-CM

## 2012-04-06 DIAGNOSIS — N039 Chronic nephritic syndrome with unspecified morphologic changes: Secondary | ICD-10-CM | POA: Diagnosis present

## 2012-04-06 DIAGNOSIS — E039 Hypothyroidism, unspecified: Secondary | ICD-10-CM | POA: Diagnosis present

## 2012-04-06 DIAGNOSIS — D126 Benign neoplasm of colon, unspecified: Secondary | ICD-10-CM

## 2012-04-06 DIAGNOSIS — I2510 Atherosclerotic heart disease of native coronary artery without angina pectoris: Secondary | ICD-10-CM | POA: Diagnosis present

## 2012-04-06 DIAGNOSIS — D638 Anemia in other chronic diseases classified elsewhere: Secondary | ICD-10-CM | POA: Diagnosis present

## 2012-04-06 DIAGNOSIS — N179 Acute kidney failure, unspecified: Secondary | ICD-10-CM | POA: Diagnosis present

## 2012-04-06 DIAGNOSIS — R42 Dizziness and giddiness: Secondary | ICD-10-CM

## 2012-04-06 DIAGNOSIS — IMO0002 Reserved for concepts with insufficient information to code with codable children: Secondary | ICD-10-CM

## 2012-04-06 DIAGNOSIS — N183 Chronic kidney disease, stage 3 unspecified: Secondary | ICD-10-CM

## 2012-04-06 DIAGNOSIS — I5033 Acute on chronic diastolic (congestive) heart failure: Principal | ICD-10-CM

## 2012-04-06 DIAGNOSIS — I509 Heart failure, unspecified: Secondary | ICD-10-CM

## 2012-04-06 DIAGNOSIS — I2699 Other pulmonary embolism without acute cor pulmonale: Secondary | ICD-10-CM

## 2012-04-06 DIAGNOSIS — I10 Essential (primary) hypertension: Secondary | ICD-10-CM | POA: Diagnosis present

## 2012-04-06 DIAGNOSIS — Z7982 Long term (current) use of aspirin: Secondary | ICD-10-CM

## 2012-04-06 DIAGNOSIS — I5032 Chronic diastolic (congestive) heart failure: Secondary | ICD-10-CM

## 2012-04-06 DIAGNOSIS — I359 Nonrheumatic aortic valve disorder, unspecified: Secondary | ICD-10-CM

## 2012-04-06 DIAGNOSIS — I129 Hypertensive chronic kidney disease with stage 1 through stage 4 chronic kidney disease, or unspecified chronic kidney disease: Secondary | ICD-10-CM | POA: Diagnosis present

## 2012-04-06 DIAGNOSIS — D500 Iron deficiency anemia secondary to blood loss (chronic): Secondary | ICD-10-CM

## 2012-04-06 DIAGNOSIS — Z950 Presence of cardiac pacemaker: Secondary | ICD-10-CM

## 2012-04-06 DIAGNOSIS — D631 Anemia in chronic kidney disease: Secondary | ICD-10-CM | POA: Diagnosis present

## 2012-04-06 DIAGNOSIS — R400 Somnolence: Secondary | ICD-10-CM

## 2012-04-06 DIAGNOSIS — I350 Nonrheumatic aortic (valve) stenosis: Secondary | ICD-10-CM

## 2012-04-06 DIAGNOSIS — R1319 Other dysphagia: Secondary | ICD-10-CM

## 2012-04-06 DIAGNOSIS — K2211 Ulcer of esophagus with bleeding: Secondary | ICD-10-CM

## 2012-04-06 DIAGNOSIS — R531 Weakness: Secondary | ICD-10-CM

## 2012-04-06 DIAGNOSIS — Z66 Do not resuscitate: Secondary | ICD-10-CM | POA: Diagnosis present

## 2012-04-06 DIAGNOSIS — R5381 Other malaise: Secondary | ICD-10-CM

## 2012-04-06 DIAGNOSIS — R197 Diarrhea, unspecified: Secondary | ICD-10-CM

## 2012-04-06 DIAGNOSIS — F0390 Unspecified dementia without behavioral disturbance: Secondary | ICD-10-CM | POA: Diagnosis present

## 2012-04-06 DIAGNOSIS — R627 Adult failure to thrive: Secondary | ICD-10-CM | POA: Diagnosis present

## 2012-04-06 HISTORY — DX: Presence of cardiac pacemaker: Z95.0

## 2012-04-06 HISTORY — DX: Heart failure, unspecified: I50.9

## 2012-04-06 HISTORY — DX: Chronic kidney disease, unspecified: N18.9

## 2012-04-06 LAB — TROPONIN I
Troponin I: <0.3 ng/mL (ref <0.30)
Troponin I: <0.3 ng/mL (ref <0.30)

## 2012-04-06 LAB — URINALYSIS, ROUTINE W REFLEX MICROSCOPIC
Bilirubin Urine: NEGATIVE
Leukocytes, UA: NEGATIVE
Nitrite: NEGATIVE
Specific Gravity, Urine: 1.013 (ref 1.005–1.030)
Urobilinogen, UA: 1 mg/dL (ref 0.0–1.0)

## 2012-04-06 LAB — CBC WITH DIFFERENTIAL/PLATELET
Eosinophils Absolute: 0 10*3/uL (ref 0.0–0.7)
Eosinophils Relative: 0 % (ref 0–5)
HCT: 33.7 % — ABNORMAL LOW (ref 36.0–46.0)
Lymphocytes Relative: 9 % — ABNORMAL LOW (ref 12–46)
Lymphs Abs: 0.8 10*3/uL (ref 0.7–4.0)
MCH: 26.4 pg (ref 26.0–34.0)
MCV: 84 fL (ref 78.0–100.0)
Monocytes Absolute: 0.8 10*3/uL (ref 0.1–1.0)
Monocytes Relative: 9 % (ref 3–12)
RBC: 4.01 MIL/uL (ref 3.87–5.11)
WBC: 8.7 10*3/uL (ref 4.0–10.5)

## 2012-04-06 LAB — COMPREHENSIVE METABOLIC PANEL
ALT: 15 U/L (ref 0–35)
BUN: 26 mg/dL — ABNORMAL HIGH (ref 6–23)
CO2: 23 mEq/L (ref 19–32)
Calcium: 9.3 mg/dL (ref 8.4–10.5)
Creatinine, Ser: 1.18 mg/dL — ABNORMAL HIGH (ref 0.50–1.10)
GFR calc Af Amer: 45 mL/min — ABNORMAL LOW (ref >90)
GFR calc non Af Amer: 39 mL/min — ABNORMAL LOW (ref >90)
Glucose, Bld: 110 mg/dL — ABNORMAL HIGH (ref 70–99)

## 2012-04-06 MED ORDER — ACETAMINOPHEN 80 MG PO CHEW: 500.0000 mg | CHEWABLE_TABLET | Freq: Four times a day (QID) | ORAL | Status: DC | PRN

## 2012-04-06 MED ORDER — ASPIRIN EC 81 MG PO TBEC
81.0000 mg | DELAYED_RELEASE_TABLET | Freq: Every day | ORAL | Status: DC
  Administered 2012-04-07 – 2012-04-08 (×2): 81 mg via ORAL
  Filled 2012-04-06 (×2): qty 1

## 2012-04-06 MED ORDER — ACETAMINOPHEN 80 MG PO CHEW
500.0000 mg | CHEWABLE_TABLET | Freq: Four times a day (QID) | ORAL | Status: DC | PRN
  Filled 2012-04-06: qty 7

## 2012-04-06 MED ORDER — INFLUENZA VIRUS VACC SPLIT PF IM SUSP
0.5000 mL | INTRAMUSCULAR | Status: AC
  Administered 2012-04-07: 0.5 mL via INTRAMUSCULAR
  Filled 2012-04-06: qty 0.5

## 2012-04-06 MED ORDER — ATORVASTATIN CALCIUM 20 MG PO TABS
20.0000 mg | ORAL_TABLET | Freq: Every day | ORAL | Status: DC
  Administered 2012-04-06 – 2012-04-07 (×2): 20 mg via ORAL
  Filled 2012-04-06 (×3): qty 1

## 2012-04-06 MED ORDER — DONEPEZIL HCL 10 MG PO TABS
10.0000 mg | ORAL_TABLET | Freq: Every day | ORAL | Status: DC
  Administered 2012-04-06 – 2012-04-07 (×2): 10 mg via ORAL
  Filled 2012-04-06 (×3): qty 1

## 2012-04-06 MED ORDER — ACETAMINOPHEN 500 MG PO TABS
500.0000 mg | ORAL_TABLET | Freq: Four times a day (QID) | ORAL | Status: DC | PRN
  Administered 2012-04-08: 500 mg via ORAL
  Filled 2012-04-06 (×2): qty 1

## 2012-04-06 MED ORDER — LABETALOL HCL 200 MG PO TABS
200.0000 mg | ORAL_TABLET | Freq: Two times a day (BID) | ORAL | Status: DC
  Administered 2012-04-06 – 2012-04-08 (×4): 200 mg via ORAL
  Filled 2012-04-06 (×5): qty 1

## 2012-04-06 MED ORDER — LOPERAMIDE HCL 2 MG PO CAPS: 2.0000 mg | ORAL_CAPSULE | ORAL | Status: DC | PRN

## 2012-04-06 MED ORDER — TRAMADOL HCL 50 MG PO TABS
50.0000 mg | ORAL_TABLET | Freq: Every evening | ORAL | Status: DC | PRN
  Filled 2012-04-06: qty 1

## 2012-04-06 MED ORDER — SODIUM CHLORIDE 0.9 % IV SOLN
Freq: Once | INTRAVENOUS | Status: AC
  Administered 2012-04-06: 13:00:00 via INTRAVENOUS

## 2012-04-06 MED ORDER — FUROSEMIDE 10 MG/ML IJ SOLN
20.0000 mg | Freq: Every day | INTRAMUSCULAR | Status: DC
  Administered 2012-04-06 – 2012-04-07 (×2): 20 mg via INTRAVENOUS
  Filled 2012-04-06 (×2): qty 2

## 2012-04-06 MED ORDER — VITAMIN C 500 MG PO TABS
500.0000 mg | ORAL_TABLET | Freq: Every day | ORAL | Status: DC
  Administered 2012-04-07 – 2012-04-08 (×2): 500 mg via ORAL
  Filled 2012-04-06 (×2): qty 1

## 2012-04-06 MED ORDER — ASPIRIN 81 MG PO TABS
81.0000 mg | ORAL_TABLET | Freq: Every day | ORAL | Status: DC
Start: 2012-04-06 — End: 2012-04-06

## 2012-04-06 MED ORDER — LEFLUNOMIDE 20 MG PO TABS
20.0000 mg | ORAL_TABLET | Freq: Every day | ORAL | Status: DC
  Administered 2012-04-07 – 2012-04-08 (×2): 20 mg via ORAL
  Filled 2012-04-06 (×2): qty 1

## 2012-04-06 MED ORDER — MONTELUKAST SODIUM 10 MG PO TABS
10.0000 mg | ORAL_TABLET | Freq: Every day | ORAL | Status: DC
  Administered 2012-04-06 – 2012-04-07 (×2): 10 mg via ORAL
  Filled 2012-04-06 (×3): qty 1

## 2012-04-06 MED ORDER — LEVOTHYROXINE SODIUM 125 MCG PO TABS
125.0000 ug | ORAL_TABLET | Freq: Every day | ORAL | Status: DC
  Administered 2012-04-07 – 2012-04-08 (×2): 125 ug via ORAL
  Filled 2012-04-06 (×3): qty 1

## 2012-04-06 MED ORDER — ENSURE COMPLETE PO LIQD
237.0000 mL | Freq: Three times a day (TID) | ORAL | Status: DC
  Administered 2012-04-06 – 2012-04-08 (×5): 237 mL via ORAL

## 2012-04-06 MED ORDER — PREDNISONE 5 MG PO TABS
7.5000 mg | ORAL_TABLET | Freq: Every day | ORAL | Status: DC
  Administered 2012-04-07 – 2012-04-08 (×2): 7.5 mg via ORAL
  Filled 2012-04-06 (×2): qty 1

## 2012-04-06 NOTE — ED Provider Notes (Signed)
History     CSN: [id number]  Arrival date & time 04/06/12  1055   First MD Initiated Contact with Patient 04/06/12 1057      Chief Complaint  Patient presents with  . Weakness    (Consider location/radiation/quality/duration/timing/severity/associated sxs/prior treatment) Patient is a 78 y.o. female presenting with weakness. The history is provided by a relative. The history is limited by the condition of the patient (dementia).  Weakness  Additional symptoms include weakness.  She has been having weight loss and progressive weakness over about the last 2 weeks. There's been approximately a 22 pound weight loss during this time. Today, she was awakened she was unable to get off of the bus which took her to a senior [employer]. She normally is tentatively with a walker and this is the first time she was not able to ambulate. Her daughter states that she's not been needing or drinking very much and urinary output has decreased. There've been no known fever or chills no vomiting or diarrhea. Patient tells me that she feels bad but is unable to articulate it any further.  Past Medical History  Diagnosis Date  . Chronic diastolic heart failure   . Rheumatoid arthritis   . Ulcer of esophagus with bleeding   . Essential hypertension, benign   . Benign neoplasm of colon   . Iron deficiency anemia secondary to blood loss (chronic)   . Syncope and collapse   . Pulmonary embolism     IVC filter  . Aortic stenosis     echo 4/11: mod LVH, mild focal basal septal hypertrophy, EF 55-60%, grade 1 diast dysfxn, critical AS, mild AI, AVA 0.6 cm2, mean gradient across AV 73 mmHg, mild MR, mild LAE, mild elevated PASP.  Marland Kitchen Musculoskeletal chest pain   . Dementia   . Hypothyroidism   . HLD (hyperlipidemia)   . GI bleed     while on coumadin  . Diverticulosis     severe in the sigmond colon  . CAD (coronary artery disease)     LHC 11/2000: EF 60%, pLAD 25%, dLAD 20%, D1 30%, mCFX 30%, pOM2  50% and lateral branch 60%, pRCA 20%.    Past Surgical History  Procedure Date  . Ivc filter 07/2009  . Knee arthroscopy   . Cataract extraction   . Polyp removal     sessile polyp inascending colon    Family History  Problem Relation Age of Onset  . Breast cancer Daughter   . Esophageal cancer Daughter   . Prostate cancer Other     nephew  . Diabetes Daughter   . Heart disease Other     family history    History  Substance Use Topics  . Smoking status: Never Smoker   . Smokeless tobacco: Never Used  . Alcohol Use: No    OB History    Grav Para Term Preterm Abortions TAB SAB Ect Mult Living                  Review of Systems  Unable to perform ROS: Dementia  Neurological: Positive for weakness.    Allergies  Vioxx  Home Medications   Current Outpatient Rx  Name  Route  Sig  Dispense  Refill  . ACETAMINOPHEN 500 MG PO CHEW   Oral   Chew 500-1,000 mg by mouth every 6 (six) hours as needed. For pain         . ASPIRIN 81 MG PO TABS   Oral  Take 81 mg by mouth daily.         . ATORVASTATIN CALCIUM 20 MG PO TABS   Oral   Take 20 mg by mouth at bedtime.          . DONEPEZIL HCL 10 MG PO TABS   Oral   Take 10 mg by mouth at bedtime.         . FUROSEMIDE 40 MG PO TABS   Oral   Take 1 tablet (40 mg total) by mouth daily.   30 tablet   6   . LABETALOL HCL 200 MG PO TABS   Oral   Take 1 tablet (200 mg total) by mouth 2 (two) times daily.   60 tablet   11   . LEFLUNOMIDE 20 MG PO TABS   Oral   Take 20 mg by mouth daily.           Marland Kitchen LEVOTHYROXINE SODIUM 125 MCG PO TABS   Oral   Take 125 mcg by mouth daily.         Marland Kitchen LOPERAMIDE HCL 2 MG PO CAPS   Oral   Take 2 mg by mouth as needed. For diarrhea         . MONTELUKAST SODIUM 10 MG PO TABS   Oral   Take 10 mg by mouth at bedtime.         . NON FORMULARY   Oral   Take 15 mLs by mouth daily. Pottasium liquid  1 tablspoon daily         . PREDNISONE 5 MG PO TABS   Oral    Take 7.5 mg by mouth daily.          . TRAMADOL HCL 50 MG PO TABS   Oral   Take 50 mg by mouth at bedtime as needed. For pain         . VITAMIN B1-B12 IM   Intramuscular   Inject 1,000 mg into the muscle every 30 (thirty) days.         Marland Kitchen VITAMIN C 500 MG PO TABS   Oral   Take 500 mg by mouth daily.             BP 137/104  Pulse 80  Temp 98.5 F (36.9 C) (Oral)  Resp 18  Ht 5\' 5"  (1.651 m)  Wt 145 lb (65.772 kg)  BMI 24.13 kg/m2  SpO2 100%  Physical Exam  Nursing note and vitals reviewed. 78 year old female, resting comfortably and in no acute distress. Vital signs are significant for hypertension with blood pressure 137/104. Oxygen saturation is 100%, which is normal. Head is normocephalic and atraumatic. PERRLA, EOMI. Oropharynx is clear. Left ptosis is noted. Neck is nontender and supple without adenopathy or JVD. Back is nontender and there is no CVA tenderness. Lungs are clear without rales, wheezes, or rhonchi. Chest is nontender. Heart has regular rate and rhythm without murmur. Abdomen is soft, flat, nontender without masses or hepatosplenomegaly and peristalsis is normoactive. Extremities have no cyanosis or edema, full range of motion is present. Skin is warm and dry without rash. Neurologic: She is oriented to person and knows she is in a hospital but is unaware of which hospital and why she is here and she is not oriented at all to time, cranial nerves are intact, there are no motor or sensory deficits.   ED Course  Procedures (including critical care time)  Results for orders placed during the hospital encounter  of 04/06/12  CBC WITH DIFFERENTIAL      Component Value Range   WBC 8.7  4.0 - 10.5 K/uL   RBC 4.01  3.87 - 5.11 MIL/uL   Hemoglobin 10.6 (*) 12.0 - 15.0 g/dL   HCT 16.1 (*) 09.6 - 04.5 %   MCV 84.0  78.0 - 100.0 fL   MCH 26.4  26.0 - 34.0 pg   MCHC 31.5  30.0 - 36.0 g/dL   RDW 40.9 (*) 81.1 - 91.4 %   Platelets 163  150 - 400 K/uL    Neutrophils Relative 82 (*) 43 - 77 %   Neutro Abs 7.1  1.7 - 7.7 K/uL   Lymphocytes Relative 9 (*) 12 - 46 %   Lymphs Abs 0.8  0.7 - 4.0 K/uL   Monocytes Relative 9  3 - 12 %   Monocytes Absolute 0.8  0.1 - 1.0 K/uL   Eosinophils Relative 0  0 - 5 %   Eosinophils Absolute 0.0  0.0 - 0.7 K/uL   Basophils Relative 0  0 - 1 %   Basophils Absolute 0.0  0.0 - 0.1 K/uL  COMPREHENSIVE METABOLIC PANEL      Component Value Range   Sodium 138  135 - 145 mEq/L   Potassium 4.3  3.5 - 5.1 mEq/L   Chloride 103  96 - 112 mEq/L   CO2 23  19 - 32 mEq/L   Glucose, Bld 110 (*) 70 - 99 mg/dL   BUN 26 (*) 6 - 23 mg/dL   Creatinine, Ser 7.82 (*) 0.50 - 1.10 mg/dL   Calcium 9.3  8.4 - 95.6 mg/dL   Total Protein 6.7  6.0 - 8.3 g/dL   Albumin 3.3 (*) 3.5 - 5.2 g/dL   AST 24  0 - 37 U/L   ALT 15  0 - 35 U/L   Alkaline Phosphatase 99  39 - 117 U/L   Total Bilirubin 0.5  0.3 - 1.2 mg/dL   GFR calc non Af Amer 39 (*) >90 mL/min   GFR calc Af Amer 45 (*) >90 mL/min  URINALYSIS, ROUTINE W REFLEX MICROSCOPIC      Component Value Range   Color, Urine YELLOW  YELLOW   APPearance CLEAR  CLEAR   Specific Gravity, Urine 1.013  1.005 - 1.030   pH 5.0  5.0 - 8.0   Glucose, UA NEGATIVE  NEGATIVE mg/dL   Hgb urine dipstick NEGATIVE  NEGATIVE   Bilirubin Urine NEGATIVE  NEGATIVE   Ketones, ur NEGATIVE  NEGATIVE mg/dL   Protein, ur NEGATIVE  NEGATIVE mg/dL   Urobilinogen, UA 1.0  0.0 - 1.0 mg/dL   Nitrite NEGATIVE  NEGATIVE   Leukocytes, UA NEGATIVE  NEGATIVE  PRO B NATRIURETIC PEPTIDE      Component Value Range   Pro B Natriuretic peptide (BNP) 17664.0 (*) 0 - 450 pg/mL   Dg Chest Portable 1 View  04/06/2012  *RADIOLOGY REPORT*  Clinical Data: Weakness.  Coronary artery disease.  Personal history pulmonary embolism.  PORTABLE CHEST - 1 VIEW  Comparison: 11/16/2011 the  Findings: Cardiomegaly and pulmonary vascular congestion appears stable.  No evidence of acute infiltrate or edema.  No evidence of pleural  effusion.  Ectasia and atherosclerotic calcification of the thoracic aorta appears stable.  IMPRESSION: Stable cardiomegaly and pulmonary vascular congestion.  No acute findings.   Original Report Authenticated By: Myles Rosenthal, M.D.    Images viewed by me.  Date: 04/06/2012  Rate: 80  Rhythm: normal  sinus rhythm  QRS Axis: right  Intervals: normal  ST/T Wave abnormalities: normal  Conduction Disutrbances:right bundle branch block  Narrative Interpretation: Right axis deviation, right bundle branch block, left atrial hypertrophy. When compared with ECG of 02/23/2011, no significant change is seen.  Old EKG Reviewed: unchanged    1. Weakness   2. CHF exacerbation       MDM  Progressive weakness with weight loss. She will be evaluated for possible occult infection and may need evaluation for occult malignancy.  After revieweing her chest x-ray, I am concerned that there is an increase in the fluid in the a.m. minor fissure which may represent CHF, so beta natruretic protein level be checked.  Beta natruretic protein is extremely high, so CHF may certainly be a component of her weakness.   Case is discussed with Dr. Allena Katz of [facility] who agrees to admit the patient.      Dione Booze, MD 04/06/12 [id number]

## 2012-04-06 NOTE — H&P (Addendum)
[facility] History and Physical  [name] ZOX:[id number] DOB: 02-Aug-1917 DOA: 04/06/2012  Referring physician: Dr. Preston Fleeting PCP: Laurena Slimmer, MD  Specialists: none  Chief Complaint:   HPI: [name] is a 79 y.o. female. She is visiting her daughter for about 3 weeks from [place]. She came to ED today as family reported that she is getting weaker and lost about 20 lb in last 3 weeks. She is too weak to move around. [facility] [place] [facility] and while returning she was not able to stand and ambulate off bus. [facility] called. Pt felt bad for about a week. Not drinking much and UOP is less. No infectious symptoms noted. She has decreased appetite. Has sharp chest pain in middle of chest without any radiation. It comes and goes. SOB is present on walking and rest.   Review of Systems: The patient denies fever, vision loss, decreased hearing, hoarseness, syncope, peripheral edema, balance deficits, hemoptysis, abdominal pain, melena, hematochezia, severe indigestion/heartburn, hematuria, incontinence, genital sores, muscle weakness, suspicious skin lesions, transient blindness, depression, abnormal bleeding, enlarged lymph nodes, angioedema, and breast masses.    Past Medical History  Diagnosis Date  . Chronic diastolic heart failure   . Rheumatoid arthritis   . Ulcer of esophagus with bleeding   . Essential hypertension, benign   . Benign neoplasm of colon   . Iron deficiency anemia secondary to blood loss (chronic)   . Syncope and collapse   . Pulmonary embolism     IVC filter  . Aortic stenosis     echo 4/11: mod LVH, mild focal basal septal hypertrophy, EF 55-60%, grade 1 diast dysfxn, critical AS, mild AI, AVA 0.6 cm2, mean gradient across AV 73 mmHg, mild MR, mild LAE, mild elevated PASP.  Marland Kitchen Musculoskeletal chest pain   . Dementia   . Hypothyroidism   . HLD (hyperlipidemia)   . GI bleed     while on coumadin  . Diverticulosis     severe in the sigmond  colon  . CAD (coronary artery disease)     LHC 11/2000: EF 60%, pLAD 25%, dLAD 20%, D1 30%, mCFX 30%, pOM2 50% and lateral branch 60%, pRCA 20%.  . Pacemaker   . CHF (congestive heart failure)   . Chronic kidney disease    Past Surgical History  Procedure Date  . Ivc filter 07/2009  . Knee arthroscopy   . Cataract extraction   . Polyp removal     sessile polyp inascending colon   Social History:  reports that she has never smoked. She has never used smokeless tobacco. She reports that she does not drink alcohol or use illicit drugs.   Allergies  Allergen Reactions  . Vioxx (Rofecoxib) Nausea And Vomiting    Unknown     Family History  Problem Relation Age of Onset  . Breast cancer Daughter   . Esophageal cancer Daughter   . Prostate cancer Other     nephew  . Diabetes Daughter   . Heart disease Other     family history   Prior to Admission medications   Medication Sig Start Date End Date Taking? Authorizing Provider  acetaminophen (TYLENOL) 500 MG chewable tablet Chew 500-1,000 mg by mouth every 6 (six) hours as needed. For pain   Yes Historical Provider, MD  aspirin 81 MG tablet Take 81 mg by mouth daily.   Yes Historical Provider, MD  atorvastatin (LIPITOR) 20 MG tablet Take 20 mg by mouth at bedtime.  Yes Historical Provider, MD  donepezil (ARICEPT) 10 MG tablet Take 10 mg by mouth at bedtime.   Yes Historical Provider, MD  furosemide (LASIX) 40 MG tablet Take 1 tablet (40 mg total) by mouth daily. 10/20/11 10/19/12 Yes Duke Salvia, MD  labetalol (NORMODYNE) 200 MG tablet Take 1 tablet (200 mg total) by mouth 2 (two) times daily. 12/02/11  Yes Tonny Bollman, MD  leflunomide (ARAVA) 20 MG tablet Take 20 mg by mouth daily.     Yes Historical Provider, MD  levothyroxine (SYNTHROID, LEVOTHROID) 125 MCG tablet Take 125 mcg by mouth daily.   Yes Historical Provider, MD  loperamide (IMODIUM) 2 MG capsule Take 2 mg by mouth as needed. For diarrhea   Yes Historical Provider,  MD  montelukast (SINGULAIR) 10 MG tablet Take 10 mg by mouth at bedtime.   Yes Historical Provider, MD  NON FORMULARY Take 15 mLs by mouth daily. Pottasium liquid  1 tablspoon daily   Yes Historical Provider, MD  predniSONE (DELTASONE) 5 MG tablet Take 7.5 mg by mouth daily.    Yes Historical Provider, MD  traMADol (ULTRAM) 50 MG tablet Take 50 mg by mouth at bedtime as needed. For pain   Yes Historical Provider, MD  VITAMIN B1-B12 IM Inject 1,000 mg into the muscle every 30 (thirty) days.   Yes Historical Provider, MD  vitamin C (ASCORBIC ACID) 500 MG tablet Take 500 mg by mouth daily.     Yes Historical Provider, MD   Physical Exam: Filed Vitals:   04/06/12 1300 04/06/12 1400 04/06/12 1445 04/06/12 1524  BP: 173/50 141/64 165/76 145/57  Pulse: 84 76 84 77  Temp:    97.9 F (36.6 C)  TempSrc:    Oral  Resp: 19 21 30 20   Height:    5\' 7"  (1.702 m)  Weight:    65.2 kg (143 lb 11.8 oz)  SpO2: 100% 94% 100% 100%     General:  Alert, oriented x 2  Eyes: PEERLA, EOMI  ENT: OMM, no thrush,   Neck: NO LN or JVD  Cardiovascular: S1S2 reg, has murmur  Respiratory: basal crackles posteriorly, GBAE  Abdomen: NT, ND, BS +  Skin: No rash or bruises  Musculoskeletal: FROM but weak  Psychiatric: good mood  Neurologic: grossly normal.   Ext : tender calf, no c/c/e  Labs on Admission:  Basic Metabolic Panel:  Lab 04/06/12 9562  NA 138  K 4.3  CL 103  CO2 23  GLUCOSE 110*  BUN 26*  CREATININE 1.18*  CALCIUM 9.3  MG --  PHOS --   Liver Function Tests:  Lab 04/06/12 1155  AST 24  ALT 15  ALKPHOS 99  BILITOT 0.5  PROT 6.7  ALBUMIN 3.3*   No results found for this basename: LIPASE:5,AMYLASE:5 in the last 168 hours No results found for this basename: AMMONIA:5 in the last 168 hours CBC:  Lab 04/06/12 1155  WBC 8.7  NEUTROABS 7.1  HGB 10.6*  HCT 33.7*  MCV 84.0  PLT 163   BNP (last 3 results)  Basename 04/06/12 1155  PROBNP 17664.0*   Radiological  Exams on Admission: Dg Chest Portable 1 View  04/06/2012  *RADIOLOGY REPORT*  Clinical Data: Weakness.  Coronary artery disease.  Personal history pulmonary embolism.  PORTABLE CHEST - 1 VIEW  Comparison: 11/16/2011 the  Findings: Cardiomegaly and pulmonary vascular congestion appears stable.  No evidence of acute infiltrate or edema.  No evidence of pleural effusion.  Ectasia and atherosclerotic calcification of  the thoracic aorta appears stable.  IMPRESSION: Stable cardiomegaly and pulmonary vascular congestion.  No acute findings.   Original Report Authenticated By: Myles Rosenthal, M.D.     EKG: Independently reviewed.   Assessment/Plan Principal Problem:  *CHF exacerbation Active Problems:  Chronic diastolic heart failure  Weakness  Anemia of chronic disease  DEMENTIA  Essential hypertension, benign  AORTIC STENOSIS   1. CHF exacerbation (diastolic dysfunction/failure). Monitor patient closely. We will cont lasix. Follow BNP. Follow CIE x 3, low sodium diet, strict I/O 2. Acute on chronic kidney disease : monitor function 3. Gen weakness : PT/OT and speech eval. Ensure added.  4. ACD : continue supplements, monitor H/H 5. Dementia : Cont home meds 6. HTN : cont home meds 7. AS : may consider cardiac eval if needed.   Code Status: DNR Family Communication: no family in the room.  Disposition Plan: pending eval  Time spent: 1 hour Nova Schmuhl V. [facility] Pager [phone] If 7PM-7AM, please contact night-coverage [email or web address] Password [email or web address] 04/06/2012, 4:50 PM

## 2012-04-06 NOTE — ED Notes (Signed)
Pt here per [facility] with family reporting pt was too weak to ambulate earlier.  Pt rode the bus to [facility] this am and when returned home pt wasn't able to stand and ambulate off of bus.  [facility] called, pt was able to stand for [facility] and sit on stretcher.  Pt states she has "been sick for a week".  Pt advises she has been "feeling bad".

## 2012-04-07 DIAGNOSIS — I5033 Acute on chronic diastolic (congestive) heart failure: Secondary | ICD-10-CM | POA: Diagnosis present

## 2012-04-07 DIAGNOSIS — I350 Nonrheumatic aortic (valve) stenosis: Secondary | ICD-10-CM | POA: Diagnosis present

## 2012-04-07 DIAGNOSIS — M79609 Pain in unspecified limb: Secondary | ICD-10-CM

## 2012-04-07 DIAGNOSIS — I10 Essential (primary) hypertension: Secondary | ICD-10-CM

## 2012-04-07 DIAGNOSIS — I359 Nonrheumatic aortic valve disorder, unspecified: Secondary | ICD-10-CM

## 2012-04-07 LAB — COMPREHENSIVE METABOLIC PANEL
AST: 18 U/L (ref 0–37)
Albumin: 3.1 g/dL — ABNORMAL LOW (ref 3.5–5.2)
Alkaline Phosphatase: 84 U/L (ref 39–117)
BUN: 25 mg/dL — ABNORMAL HIGH (ref 6–23)
Chloride: 105 mEq/L (ref 96–112)
Potassium: 3.8 mEq/L (ref 3.5–5.1)
Sodium: 142 mEq/L (ref 135–145)
Total Protein: 6.2 g/dL (ref 6.0–8.3)

## 2012-04-07 LAB — CBC WITH DIFFERENTIAL/PLATELET
Basophils Absolute: 0 10*3/uL (ref 0.0–0.1)
Basophils Relative: 0 % (ref 0–1)
Eosinophils Absolute: 0.1 10*3/uL (ref 0.0–0.7)
MCH: 25.6 pg — ABNORMAL LOW (ref 26.0–34.0)
MCHC: 30.9 g/dL (ref 30.0–36.0)
Monocytes Relative: 11 % (ref 3–12)
Neutro Abs: 4.4 10*3/uL (ref 1.7–7.7)
Neutrophils Relative %: 66 % (ref 43–77)
Platelets: 167 10*3/uL (ref 150–400)
RDW: 16 % — ABNORMAL HIGH (ref 11.5–15.5)

## 2012-04-07 LAB — MAGNESIUM: Magnesium: 2.2 mg/dL (ref 1.5–2.5)

## 2012-04-07 LAB — TROPONIN I: Troponin I: <0.3 ng/mL (ref <0.30)

## 2012-04-07 MED ORDER — ENOXAPARIN SODIUM 30 MG/0.3ML ~~LOC~~ SOLN
30.0000 mg | SUBCUTANEOUS | Status: DC
  Administered 2012-04-07: 30 mg via SUBCUTANEOUS
  Filled 2012-04-07 (×2): qty 0.3

## 2012-04-07 MED ORDER — FUROSEMIDE 10 MG/ML IJ SOLN
20.0000 mg | Freq: Once | INTRAMUSCULAR | Status: AC
  Administered 2012-04-07: 20 mg via INTRAVENOUS

## 2012-04-07 MED ORDER — FUROSEMIDE 10 MG/ML IJ SOLN
40.0000 mg | Freq: Every day | INTRAMUSCULAR | Status: DC
  Administered 2012-04-08: 40 mg via INTRAVENOUS
  Filled 2012-04-07 (×2): qty 4

## 2012-04-07 NOTE — Progress Notes (Signed)
[facility] PROGRESS NOTE  [name] QMV:[id number] DOB: 25-Feb-1918 DOA: 04/06/2012 PCP: Laurena Slimmer, MD  HPI:  [name] is a 78 y.o. female with history of critical aortic stenosis, has undergone palliative balloon aortic valvuloplasty in the setting of class IV congestive heart failure, on Hospice. She has progressively continued to decline. She came to ED on 11/15 as family reported that she is getting weaker and lost about 20 lb in last 3 weeks. She is too weak to move around. [facility] [place] [facility] and while returning she was not able to stand and ambulate off bus. [facility] called. Pt felt bad for about a week. Not drinking much and UOP is less. No infectious symptoms noted. She has decreased appetite. Has sharp chest pain in middle of chest without any radiation. It comes and goes. SOB is present on walking and rest.   Assessment/Plan: 1. Acute on chronic diastolic heart failure, complicated by critical aortic stenosis: Gentle diuresis and monitor. 2. Critical aortic stenosis: Not a candidate for any further vascular intervention, as per her primary cardiologist no 03/02/2012. 3. Hypertension: Reasonably controlled. 4. Stage III chronic kidney disease: Creatinine at baseline. 5. Chronic normocytic anemia: Stable 6. Failure to thrive: Secondary to advanced age, CHF and AS: Likely cause for poor by mouth intake and weight loss. Appropriate for hospice care. 7. Hypothyroid: Continue Synthroid 8. Dementia: Continue Aricept  Code Status: DO NOT RESUSCITATE Family Communication: Discussed with patient's daughter Ms. Bearl Mulberry Disposition Plan: Home when medically stable.   Consultants:  None  Procedures:  None  Antibiotics:  None  HPI/Subjective: Patient is a poor historian. She knows that she is in the hospital but cannot tell why. Denies complaints. Denies dyspnea or chest pain.  Objective: Filed Vitals:   04/06/12 2157 04/07/12 0521  04/07/12 1009 04/07/12 1419  BP: 144/52 139/54 147/64 120/49  Pulse: 79 76 73 76  Temp:  98.9 F (37.2 C) 98.9 F (37.2 C) 98.9 F (37.2 C)  TempSrc:  Oral Oral Oral  Resp:  18 18 19   Height:      Weight:      SpO2:  97% 97% 100%    Intake/Output Summary (Last 24 hours) at 04/07/12 1438 Last data filed at 04/07/12 1419  Gross per 24 hour  Intake    580 ml  Output      0 ml  Net    580 ml   Filed Weights   04/06/12 1102 04/06/12 1524  Weight: 65.772 kg (145 lb) 65.2 kg (143 lb 11.8 oz)    Exam:   General exam: Elderly frail looking female patient, sitting on a reclining chair with mild increased work of breathing.  Respiratory system: Reduced breath sounds in the bases with few bibasal crackles. Rest of the lung fields are clear. Mild increased work of breathing.  Cardiovascular system: S1 and S2 heard, regular rate and rhythm. Systolic ejection murmur grade 3/6 at the apex.  Gastrointestinal system: Abdomen is nondistended, soft and nontender. Normal bowel sounds heard.  Central nervous system: Alert and oriented to self and place only. No focal neurological deficits.  Extremities: Symmetric 5 x 5 power. 1+ bilateral pitting leg edema.  Data Reviewed: Basic Metabolic Panel:  Lab 04/07/12 2841 04/06/12 1155  NA 142 138  K 3.8 4.3  CL 105 103  CO2 25 23  GLUCOSE 87 110*  BUN 25* 26*  CREATININE 1.20* 1.18*  CALCIUM 9.4 9.3  MG 2.2 --  PHOS -- --  Liver Function Tests:  Lab 04/07/12 0515 04/06/12 1155  AST 18 24  ALT 13 15  ALKPHOS 84 99  BILITOT 0.5 0.5  PROT 6.2 6.7  ALBUMIN 3.1* 3.3*   No results found for this basename: LIPASE:5,AMYLASE:5 in the last 168 hours No results found for this basename: AMMONIA:5 in the last 168 hours CBC:  Lab 04/07/12 0515 04/06/12 1155  WBC 6.7 8.7  NEUTROABS 4.4 7.1  HGB 9.6* 10.6*  HCT 31.1* 33.7*  MCV 82.9 84.0  PLT 167 163   Cardiac Enzymes:  Lab 04/07/12 0515 04/06/12 2225 04/06/12 1654  CKTOTAL -- --  --  CKMB -- -- --  CKMBINDEX -- -- --  TROPONINI <0.30 <0.30 <0.30   BNP (last 3 results)  Basename 04/06/12 1155  PROBNP 17664.0*   CBG: No results found for this basename: GLUCAP:5 in the last 168 hours  No results found for this or any previous visit (from the past 240 hour(s)).   Studies: Dg Chest Portable 1 View  04/06/2012  *RADIOLOGY REPORT*  Clinical Data: Weakness.  Coronary artery disease.  Personal history pulmonary embolism.  PORTABLE CHEST - 1 VIEW  Comparison: 11/16/2011 the  Findings: Cardiomegaly and pulmonary vascular congestion appears stable.  No evidence of acute infiltrate or edema.  No evidence of pleural effusion.  Ectasia and atherosclerotic calcification of the thoracic aorta appears stable.  IMPRESSION: Stable cardiomegaly and pulmonary vascular congestion.  No acute findings.   Original Report Authenticated By: Myles Rosenthal, M.D.     Scheduled Meds:    . aspirin EC  81 mg Oral Daily  . atorvastatin  20 mg Oral QHS  . donepezil  10 mg Oral QHS  . feeding supplement  237 mL Oral TID BM  . furosemide  20 mg Intravenous Daily  . [COMPLETED] influenza  inactive virus vaccine  0.5 mL Intramuscular Tomorrow-1000  . labetalol  200 mg Oral BID  . leflunomide  20 mg Oral Daily  . levothyroxine  125 mcg Oral QAC breakfast  . montelukast  10 mg Oral QHS  . predniSONE  7.5 mg Oral Daily  . vitamin C  500 mg Oral Daily  . [DISCONTINUED] aspirin  81 mg Oral Daily   Continuous Infusions:   Principal Problem:  *CHF exacerbation Active Problems:  DEMENTIA  Essential hypertension, benign  AORTIC STENOSIS  Chronic diastolic heart failure  Weakness  Anemia of chronic disease    Time spent: 25 minutes    [facility]  [facility] Pager [phone]. If 8PM-8AM, please contact night-coverage at [email or web address], password [facility] 04/07/2012, 2:38 PM  LOS: 1 day

## 2012-04-07 NOTE — Progress Notes (Signed)
INITIAL ADULT NUTRITION ASSESSMENT Date: 04/07/2012   Time: 3:37 PM Reason for Assessment: MST  INTERVENTION: 1.  General healthful diet; encourage intake of food, beverages, and supplements.  Pt is followed by hospice with acute decline.  RD to follow for appropriate interventions.  DOCUMENTATION CODES Per approved criteria  -Severe malnutrition in the context of acute illness or injury    ASSESSMENT: [name]  Dx: Diastolic CHF, acute on chronic  Hx:  Past Medical History  Diagnosis Date  . Chronic diastolic heart failure   . Rheumatoid arthritis   . Ulcer of esophagus with bleeding   . Essential hypertension, benign   . Benign neoplasm of colon   . Iron deficiency anemia secondary to blood loss (chronic)   . Syncope and collapse   . Pulmonary embolism     IVC filter  . Aortic stenosis     echo 4/11: mod LVH, mild focal basal septal hypertrophy, EF 55-60%, grade 1 diast dysfxn, critical AS, mild AI, AVA 0.6 cm2, mean gradient across AV 73 mmHg, mild MR, mild LAE, mild elevated PASP.  Marland Kitchen Musculoskeletal chest pain   . Dementia   . Hypothyroidism   . HLD (hyperlipidemia)   . GI bleed     while on coumadin  . Diverticulosis     severe in the sigmond colon  . CAD (coronary artery disease)     LHC 11/2000: EF 60%, pLAD 25%, dLAD 20%, D1 30%, mCFX 30%, pOM2 50% and lateral branch 60%, pRCA 20%.  . Pacemaker   . CHF (congestive heart failure)   . Chronic kidney disease    Past Surgical History  Procedure Date  . Ivc filter 07/2009  . Knee arthroscopy   . Cataract extraction   . Polyp removal     sessile polyp inascending colon    Related Meds:  Scheduled Meds:   . aspirin EC  81 mg Oral Daily  . atorvastatin  20 mg Oral QHS  . donepezil  10 mg Oral QHS  . enoxaparin (LOVENOX) injection  30 mg Subcutaneous Q24H  . feeding supplement  237 mL Oral TID BM  . furosemide  20 mg Intravenous Once  . furosemide  40 mg Intravenous Daily  . [COMPLETED] influenza   inactive virus vaccine  0.5 mL Intramuscular Tomorrow-1000  . labetalol  200 mg Oral BID  . leflunomide  20 mg Oral Daily  . levothyroxine  125 mcg Oral QAC breakfast  . montelukast  10 mg Oral QHS  . predniSONE  7.5 mg Oral Daily  . vitamin C  500 mg Oral Daily  . [DISCONTINUED] aspirin  81 mg Oral Daily  . [DISCONTINUED] furosemide  20 mg Intravenous Daily   Continuous Infusions:  PRN Meds:.acetaminophen, loperamide, traMADol, [DISCONTINUED] acetaminophen, [DISCONTINUED] acetaminophen   Ht: 5\' 7"  (170.2 cm)  Wt: 143 lb 11.8 oz (65.2 kg)  Ideal Wt: 61.3 kg % Ideal Wt: 106%  Usual Wt: ~160 lbs % Usual Wt: 87%  Body mass index is 22.51 kg/(m^2).  Food/Nutrition Related Hx: 20 lbs wt loss in 3 weeks reported by family  Labs:  CMP     Component Value Date/Time   NA 142 04/07/2012 0515   K 3.8 04/07/2012 0515   CL 105 04/07/2012 0515   CO2 25 04/07/2012 0515   GLUCOSE 87 04/07/2012 0515   BUN 25* 04/07/2012 0515   CREATININE 1.20* 04/07/2012 0515   CALCIUM 9.4 04/07/2012 0515   PROT 6.2 04/07/2012 0515   ALBUMIN 3.1*  04/07/2012 0515   AST 18 04/07/2012 0515   ALT 13 04/07/2012 0515   ALKPHOS 84 04/07/2012 0515   BILITOT 0.5 04/07/2012 0515   GFRNONAA 38* 04/07/2012 0515   GFRAA 44* 04/07/2012 0515    Intake: a few bites Output:   Intake/Output Summary (Last 24 hours) at 04/07/12 1540 Last data filed at 04/07/12 1419  Gross per 24 hour  Intake    580 ml  Output      0 ml  Net    580 ml  No BM since admission   Diet Order: Dysphagia 3, thin  Supplements/Tube Feeding:  Ensure Complete TID  IVF:    Estimated Nutritional Needs:   Kcal: 1610-9604 Protein: 65-78g Fluid: >1.8 L/day  Pt admitted with failure to thrive in setting of aortic stenosis and CHF.  Pt is following by hospice and recently experienced significant decline in status associated with a 20 lb wt loss in 3 weeks (12% wt loss in 3 weeks). Pt has been feeling poorly, not eating well with  decreased UOP, and SOB at rest or with activity.   MD has ordered Ensure Complete TID for pt. Pt sleeping at time of visit.  NUTRITION DIAGNOSIS: -Inadequate oral intake (NI-2.1).  Status: Ongoing  RELATED TO: weakness, poor appetite  AS EVIDENCE BY: reported wt loss, few bites consumed at meals  MONITORING/EVALUATION(Goals): 1.  Food/Beverage; improvement in intake to pt eating as desired  EDUCATION NEEDS: -No education needs identified at this time   Loyce Dys, MS RD LDN Clinical Inpatient Dietitian Pager: [phone] Weekend/After hours pager: [phone]

## 2012-04-07 NOTE — Progress Notes (Signed)
Room 4738 - [name] - [facility] RN Visit-R.Cyntha Brickman RN  Related admission to [facility] diagnosis of CHF.   Pt is DNR code.    Pt alert, unable to remember why she was brought to the hospital.  States she has 3/10 lower abdominal pain.  LBM dated today 11/16 per NT.  Pt just put back to bed from using the [facility], on bed alarm, advised not to attempt to get up alone.  Call bell explained and put at her finger tips.   No family present.   Please call [facility] @ [phone]- ask for RN Liaison or after hours,ask for on-call RN with any hospice needs.    Thank you.  Joneen Boers, RN  [facility]  Hospice Liaison

## 2012-04-07 NOTE — Evaluation (Signed)
Physical Therapy Evaluation Patient Details Name: [name] MRN: [id number] DOB: 09/25/1917 Today's Date: 04/07/2012 Time: 4098-1191 PT Time Calculation (min): 24 min  PT Assessment / Plan / Recommendation Clinical Impression  pt adm report of significant progressive weakness, 20 lb weight loss lately , no energy.  Found to be in CHF.  Will benefit from PT to maximize functional mobility.    PT Assessment  Patient needs continued PT services    Follow Up Recommendations  Home health PT;Other (comment) (vs none if pt bounces back)    Does the patient have the potential to tolerate intense rehabilitation      Barriers to Discharge        Equipment Recommendations  None recommended by PT    Recommendations for Other Services     Frequency Min 3X/week    Precautions / Restrictions Precautions Precautions: Fall Restrictions Weight Bearing Restrictions: No   Pertinent Vitals/Pain       Mobility  Bed Mobility Bed Mobility: Not assessed Transfers Transfers: Sit to Stand;Stand to Sit Sit to Stand: 4: Min assist;With upper extremity assist;From chair/3-in-1 Stand to Sit: 4: Min assist;With upper extremity assist;To chair/3-in-1 Details for Transfer Assistance: vc's for hand placment and getting foundation ready to stand;  assist to help pt translate forward over her BOS Ambulation/Gait Ambulation/Gait Assistance: 4: Min assist Ambulation Distance (Feet): 10 Feet (x2) Assistive device: Rolling walker Ambulation/Gait Assistance Details: short equal step lengths, weak-kneed with need to help her maneuver the RW.  Dyspnea 1-2/4 with minimal exertion Gait Pattern: Step-through pattern;Decreased step length - right;Decreased step length - left;Decreased stride length Stairs: No    Shoulder Instructions     Exercises     PT Diagnosis: Difficulty walking;Generalized weakness  PT Problem List: Decreased strength;Decreased activity tolerance;Decreased mobility;Decreased  knowledge of use of DME;Pain PT Treatment Interventions: DME instruction;Gait training;Functional mobility training;Therapeutic activities;Patient/family education   PT Goals Acute Rehab PT Goals Time For Goal Achievement: 04/14/12 Potential to Achieve Goals: Good  Visit Information  Last PT Received On: 04/07/12 Assistance Needed: +1    Subjective Data  Subjective: Pt offered very little information except yes/No's Patient Stated Goal: Pt did not state.   Prior Functioning  Home Living Lives With: Daughter;Other (Comment) (Here from [place] to see family for 3 wks) Available Help at Discharge: Family;Other (Comment) (while here for 3 wks) Type of Home: House Home Access: Stairs to enter Home Layout: One level Bathroom Shower/Tub: Walk-in shower;Door [employer] Toilet: Standard Home Adaptive Equipment: Walker - rolling;Shower chair with back (TBA) Prior Function Level of Independence: Independent Able to Take Stairs?: Yes Driving: No Communication Communication: No difficulties    Cognition  Overall Cognitive Status: Appears within functional limits for tasks assessed/performed Arousal/Alertness: Awake/alert Orientation Level: Appears intact for tasks assessed Behavior During Session: [facility] for tasks performed    Extremity/Trunk Assessment Right Lower Extremity Assessment RLE ROM/Strength/Tone: Deficits RLE ROM/Strength/Tone Deficits: generally weak overall bilaterally--tests equally 2+/5 hip flex, 3+/5 quads 3/5 hams, 3-/5 df, 3/5 pf RLE Sensation: WFL - Light Touch RLE Coordination: WFL - gross motor Left Lower Extremity Assessment LLE ROM/Strength/Tone: Deficits LLE ROM/Strength/Tone Deficits: See R LE LLE Coordination: WFL - gross motor   Balance Balance Balance Assessed: No  End of Session PT - End of Session Equipment Utilized During Treatment: Gait belt Activity Tolerance: Patient tolerated treatment well;Patient limited by fatigue Patient left: in chair;with call  bell/phone within reach;with family/visitor present Nurse Communication: Mobility status  GP     [name], Eliseo Gum 04/07/2012, 12:32  PM  04/07/2012  [place], [place] [phone] (pager)

## 2012-04-07 NOTE — Progress Notes (Signed)
*  PRELIMINARY RESULTS* Vascular Ultrasound Lower extremity venous duplex has been completed.  Preliminary findings: Bilateral:  No evidence of DVT, superficial thrombosis, or Baker's Cyst.    Farrel Demark, RDMS, RVT 04/07/2012, 2:40 PM

## 2012-04-08 LAB — CBC
HCT: 31.9 % — ABNORMAL LOW (ref 36.0–46.0)
Hemoglobin: 9.8 g/dL — ABNORMAL LOW (ref 12.0–15.0)
MCH: 25.8 pg — ABNORMAL LOW (ref 26.0–34.0)
RBC: 3.8 MIL/uL — ABNORMAL LOW (ref 3.87–5.11)

## 2012-04-08 LAB — BASIC METABOLIC PANEL
BUN: 22 mg/dL (ref 6–23)
CO2: 29 mEq/L (ref 19–32)
Calcium: 9.2 mg/dL (ref 8.4–10.5)
Glucose, Bld: 85 mg/dL (ref 70–99)
Potassium: 3.6 mEq/L (ref 3.5–5.1)
Sodium: 143 mEq/L (ref 135–145)

## 2012-04-08 MED ORDER — ENSURE COMPLETE PO LIQD: 237.0000 mL | Freq: Three times a day (TID) | ORAL | Status: AC

## 2012-04-08 NOTE — Progress Notes (Signed)
D/c to home via ems. D/c instructions and medication reviewed .Pt states understanding. All Pt questions answered

## 2012-04-08 NOTE — Discharge Summary (Signed)
Physician Discharge Summary  Ernesteen Mihalic ZOX:[id number] DOB: March 29, 1918 DOA: 04/06/2012  PCP: Laurena Slimmer, MD  Admit date: 04/06/2012 Discharge date: 04/08/2012  Time spent: Greater than 30 minutes  Recommendations for Outpatient Follow-up:  1. With Dr. Margaretmary Bayley, PCP. 2. [name] will continue to see patient at home.  Discharge Diagnoses:  Principal Problem:  *Diastolic CHF, acute on chronic Active Problems:  DEMENTIA  Essential hypertension, benign  Weakness  CHF exacerbation  Anemia of chronic disease  Chronic kidney disease, stage 3  Aortic stenosis, severe   Discharge Condition: Improved and stable  Diet recommendation: Heart healthy  Filed Weights   04/06/12 1102 04/06/12 1524 04/08/12 0417  Weight: 65.772 kg (145 lb) 65.2 kg (143 lb 11.8 oz) 65 kg (143 lb 4.8 oz)    History of present illness:  [name] is a 78 y.o. female with history of critical aortic stenosis, has undergone palliative balloon aortic valvuloplasty in the setting of class IV congestive heart failure, on Hospice. She has progressively continued to decline. She came to ED on 11/15 as family reported that she is getting weaker and lost about 20 lb in last 3 weeks. She is too weak to move around. Pt [facility] [place] [facility] and while returning she was not able to stand and ambulate off bus. [facility] called. Pt felt bad for about a week. Not drinking much and UOP is less. No infectious symptoms noted. She has decreased appetite. Has sharp chest pain in middle of chest without any radiation. It comes and goes. SOB is present on walking and rest.    Hospital Course:  1. Acute on chronic diastolic heart failure, complicated by critical aortic stenosis: Briefly placed on IV Lasix and patient has clinically improved. We'll discharge her home on her prior dose of diuretics. Continue home hospice. Her chest pain could have been secondary to CHF  exacerbation versus angina-resolved-will not be a candidate for any aggressive intervention 2. Critical aortic stenosis: Not a candidate for any further vascular intervention, as per her primary cardiologist note 03/02/2012. 3. Hypertension: Reasonably controlled. 4. Stage III chronic kidney disease: Creatinine at baseline. 5. Chronic normocytic anemia: Stable 6. Failure to thrive: Secondary to advanced age, CHF and AS: Likely cause for poor by mouth intake and weight loss. Appropriate for hospice care. 7. Hypothyroid: Continue Synthroid 8. Dementia: Continue Aricept  Procedures:  None  Consultations:  None  Discharge Exam:  Complaints Patient denies complaints. She denies dyspnea or chest pain.   Filed Vitals:   04/07/12 2124 04/08/12 0417 04/08/12 1009 04/08/12 1011  BP: 128/69 132/76 128/63 128/63  Pulse: 72 74 80 80  Temp: 98.4 F (36.9 C) 98.2 F (36.8 C) 98.4 F (36.9 C)   TempSrc: Oral Oral Oral   Resp: 18 18 20    Height:      Weight:  65 kg (143 lb 4.8 oz)    SpO2: 100% 97% 100%     General exam: Elderly frail looking female patient, lying comfortably supine in bed.  Respiratory system: Much improved breath sounds. Few basal crackles.. Rest of the lung fields are clear. No increased work of breathing.  Cardiovascular system: S1 and S2 heard, regular rate and rhythm. Systolic ejection murmur grade 3/6 at the apex. Telemetry shows mostly sinus rhythm. Patient had one episode of nonsustained supraventricular tachycardia. No JVD or gallops. Trace bilateral leg edema.  Gastrointestinal system: Abdomen is nondistended, soft and nontender. Normal bowel sounds heard.  Central nervous system: Alert and oriented to self and place only. No focal neurological deficits.  Extremities: Symmetric 5 x 5 power.    Discharge Instructions      Discharge Orders    Future Orders Please Complete By Expires   Diet - low sodium heart healthy      Increase activity slowly       (HEART FAILURE PATIENTS) Call MD:  Anytime you have any of the following symptoms: 1) 3 pound weight gain in 24 hours or 5 pounds in 1 week 2) shortness of breath, with or without a dry hacking cough 3) swelling in the hands, feet or stomach 4) if you have to sleep on extra pillows at night in order to breathe.      Call MD for:  difficulty breathing, headache or visual disturbances          Medication List     As of 04/08/2012 12:47 PM    STOP taking these medications         NON FORMULARY      TAKE these medications         acetaminophen 500 MG chewable tablet   Commonly known as: TYLENOL   Chew 500-1,000 mg by mouth every 6 (six) hours as needed. For pain      aspirin 81 MG tablet   Take 81 mg by mouth daily.      atorvastatin 20 MG tablet   Commonly known as: LIPITOR   Take 20 mg by mouth at bedtime.      bacitracin ophthalmic ointment   Place 1 application into both eyes 2 (two) times daily. apply to eye      donepezil 10 MG tablet   Commonly known as: ARICEPT   Take 10 mg by mouth at bedtime.      feeding supplement Liqd   Take 237 mLs by mouth 3 (three) times daily between meals.      furosemide 40 MG tablet   Commonly known as: LASIX   Take 1 tablet (40 mg total) by mouth daily.      labetalol 200 MG tablet   Commonly known as: NORMODYNE   Take 1 tablet (200 mg total) by mouth 2 (two) times daily.      leflunomide 20 MG tablet   Commonly known as: ARAVA   Take 20 mg by mouth daily.      levothyroxine 125 MCG tablet   Commonly known as: SYNTHROID, LEVOTHROID   Take 125 mcg by mouth daily.      loperamide 2 MG capsule   Commonly known as: IMODIUM   Take 2 mg by mouth as needed. For diarrhea      montelukast 10 MG tablet   Commonly known as: SINGULAIR   Take 10 mg by mouth at bedtime.      OVER THE COUNTER MEDICATION   Take 1 tablet by mouth daily. Life Guard antioxidant therapy      potassium chloride 20 MEQ/15ML (10%) solution   Take 10 mEq by  mouth daily.      predniSONE 5 MG tablet   Commonly known as: DELTASONE   Take 7.5 mg by mouth daily.      ranitidine 150 MG tablet   Commonly known as: ZANTAC   Take 150 mg by mouth 2 (two) times daily.      traMADol 50 MG tablet   Commonly known as: ULTRAM   Take 50 mg by mouth at bedtime as needed. For pain  VITAMIN B1-B12 IM   Inject 1,000 mg into the muscle every 30 (thirty) days.      vitamin C 500 MG tablet   Commonly known as: ASCORBIC ACID   Take 500 mg by mouth daily.      zaleplon 10 MG capsule   Commonly known as: SONATA   Take 10 mg by mouth at bedtime.        Follow-up Information    Schedule an appointment as soon as possible for a visit with Laurena Slimmer, MD.   Contact information:   [address] [place] [address] [phone]       Follow up with [facility] . (Will continue to see at home.)           The results of significant diagnostics from this hospitalization (including imaging, microbiology, ancillary and laboratory) are listed below for reference.    Significant Diagnostic Studies: Dg Chest Portable 1 View  04/06/2012  *RADIOLOGY REPORT*  Clinical Data: Weakness.  Coronary artery disease.  Personal history pulmonary embolism.  PORTABLE CHEST - 1 VIEW  Comparison: 11/16/2011 the  Findings: Cardiomegaly and pulmonary vascular congestion appears stable.  No evidence of acute infiltrate or edema.  No evidence of pleural effusion.  Ectasia and atherosclerotic calcification of the thoracic aorta appears stable.  IMPRESSION: Stable cardiomegaly and pulmonary vascular congestion.  No acute findings.   Original Report Authenticated By: Myles Rosenthal, M.D.    Bilateral lower extremity venous Dopplers  Summary:  - No evidence of deep vein thrombosis involving the right lower extremity. - No evidence of deep vein thrombosis involving the left lower extremity. - No  evidence of Baker's cyst on the right or left  Microbiology: No results found for this or any previous visit (from the past 240 hour(s)).   Labs: Basic Metabolic Panel:  Lab 04/08/12 9147 04/07/12 0515 04/06/12 1155  NA 143 142 138  K 3.6 3.8 4.3  CL 103 105 103  CO2 29 25 23   GLUCOSE 85 87 110*  BUN 22 25* 26*  CREATININE 1.22* 1.20* 1.18*  CALCIUM 9.2 9.4 9.3  MG -- 2.2 --  PHOS -- -- --   Liver Function Tests:  Lab 04/07/12 0515 04/06/12 1155  AST 18 24  ALT 13 15  ALKPHOS 84 99  BILITOT 0.5 0.5  PROT 6.2 6.7  ALBUMIN 3.1* 3.3*   No results found for this basename: LIPASE:5,AMYLASE:5 in the last 168 hours No results found for this basename: AMMONIA:5 in the last 168 hours CBC:  Lab 04/08/12 0700 04/07/12 0515 04/06/12 1155  WBC 6.0 6.7 8.7  NEUTROABS -- 4.4 7.1  HGB 9.8* 9.6* 10.6*  HCT 31.9* 31.1* 33.7*  MCV 83.9 82.9 84.0  PLT 181 167 163   Cardiac Enzymes:  Lab 04/07/12 0515 04/06/12 2225 04/06/12 1654  CKTOTAL -- -- --  CKMB -- -- --  CKMBINDEX -- -- --  TROPONINI <0.30 <0.30 <0.30   BNP: BNP (last 3 results)  Basename 04/07/12 1652 04/06/12 1155  PROBNP 17196.0* 17664.0*   CBG: No results found for this basename: GLUCAP:5 in the last 168 hours     Signed:  Maymie Brunke  [facility] 04/08/2012, 12:47 PM

## 2012-04-08 NOTE — Progress Notes (Signed)
Room 4736 - [name] - [facility] RN Visit-R.Skie Vitrano RN  Related admission to [facility] diagnosis of aortic valve disorder.  Pt is DNR code.    Pt alert, confused and forgetful, sitting off side of bed, denies any pain and is ready for discharge back to her daughter's home per attending MD note.  Called and discussed with Dr. Waymon Amato who states pt may need residential hospice or placement as dtr is having difficulty caring for patient.  Will contact [facility] SW, [facility] RN and on call re: care for pt and further plans.   No family present.  Discharge information faxed to [facility].  Please call [facility] @ [phone]- ask for RN Liaison or after hours,ask for on-call RN with any hospice needs.    Thank you.  Joneen Boers, RN  [facility]  Hospice Liaison

## 2012-04-13 ENCOUNTER — Telehealth: Payer: Self-pay | Admitting: Cardiovascular Disease

## 2012-04-13 NOTE — Telephone Encounter (Signed)
I spoke with the pt's daughter and the pt's weight on 04/05/12 was 140.  Today the pt's weight was 132.4. The pt's vital signs were okay today.  At this time we will not change the pt's medications. The pt is having very little intake.  I made [name] aware that if the pt has any issues this weekend then she can contact Hospice and have a nurse see the pt. She can also contact our office with any questions. I will call [name] on Monday to see how the pt is doing.  The Hospice nurse is scheduled to see the pt again on Tuesday.

## 2012-04-13 NOTE — Telephone Encounter (Signed)
New problem:   C/o increase sob. Oxygen sat is normal . Should adjustment to medication be made.  Vital signs today . 118/56, 68, 24.

## 2012-04-14 ENCOUNTER — Inpatient Hospital Stay (HOSPITAL_COMMUNITY)
Admission: EM | Admit: 2012-04-14 | Discharge: 2012-04-19 | DRG: 292 | Disposition: A | Discharge status: Hospice | Payer: Medicare Other | Attending: Internal Medicine | Admitting: Internal Medicine

## 2012-04-14 ENCOUNTER — Emergency Department (HOSPITAL_COMMUNITY): Payer: Medicare Other

## 2012-04-14 ENCOUNTER — Encounter (HOSPITAL_COMMUNITY): Payer: Self-pay | Admitting: Emergency Medicine

## 2012-04-14 DIAGNOSIS — I10 Essential (primary) hypertension: Secondary | ICD-10-CM

## 2012-04-14 DIAGNOSIS — I5032 Chronic diastolic (congestive) heart failure: Secondary | ICD-10-CM

## 2012-04-14 DIAGNOSIS — Z950 Presence of cardiac pacemaker: Secondary | ICD-10-CM

## 2012-04-14 DIAGNOSIS — D638 Anemia in other chronic diseases classified elsewhere: Secondary | ICD-10-CM | POA: Diagnosis present

## 2012-04-14 DIAGNOSIS — D500 Iron deficiency anemia secondary to blood loss (chronic): Secondary | ICD-10-CM

## 2012-04-14 DIAGNOSIS — Z8249 Family history of ischemic heart disease and other diseases of the circulatory system: Secondary | ICD-10-CM

## 2012-04-14 DIAGNOSIS — I359 Nonrheumatic aortic valve disorder, unspecified: Secondary | ICD-10-CM | POA: Diagnosis present

## 2012-04-14 DIAGNOSIS — F068 Other specified mental disorders due to known physiological condition: Secondary | ICD-10-CM | POA: Diagnosis present

## 2012-04-14 DIAGNOSIS — I350 Nonrheumatic aortic (valve) stenosis: Secondary | ICD-10-CM | POA: Diagnosis present

## 2012-04-14 DIAGNOSIS — N390 Urinary tract infection, site not specified: Secondary | ICD-10-CM | POA: Diagnosis present

## 2012-04-14 DIAGNOSIS — E441 Mild protein-calorie malnutrition: Secondary | ICD-10-CM | POA: Diagnosis present

## 2012-04-14 DIAGNOSIS — R1319 Other dysphagia: Secondary | ICD-10-CM

## 2012-04-14 DIAGNOSIS — F0390 Unspecified dementia without behavioral disturbance: Secondary | ICD-10-CM | POA: Diagnosis present

## 2012-04-14 DIAGNOSIS — D126 Benign neoplasm of colon, unspecified: Secondary | ICD-10-CM

## 2012-04-14 DIAGNOSIS — I5033 Acute on chronic diastolic (congestive) heart failure: Principal | ICD-10-CM | POA: Diagnosis present

## 2012-04-14 DIAGNOSIS — E785 Hyperlipidemia, unspecified: Secondary | ICD-10-CM | POA: Diagnosis present

## 2012-04-14 DIAGNOSIS — K5730 Diverticulosis of large intestine without perforation or abscess without bleeding: Secondary | ICD-10-CM | POA: Diagnosis present

## 2012-04-14 DIAGNOSIS — R42 Dizziness and giddiness: Secondary | ICD-10-CM

## 2012-04-14 DIAGNOSIS — I509 Heart failure, unspecified: Secondary | ICD-10-CM

## 2012-04-14 DIAGNOSIS — Z833 Family history of diabetes mellitus: Secondary | ICD-10-CM

## 2012-04-14 DIAGNOSIS — Z800 Family history of malignant neoplasm of digestive organs: Secondary | ICD-10-CM

## 2012-04-14 DIAGNOSIS — R531 Weakness: Secondary | ICD-10-CM

## 2012-04-14 DIAGNOSIS — E039 Hypothyroidism, unspecified: Secondary | ICD-10-CM | POA: Diagnosis present

## 2012-04-14 DIAGNOSIS — R55 Syncope and collapse: Secondary | ICD-10-CM

## 2012-04-14 DIAGNOSIS — N183 Chronic kidney disease, stage 3 unspecified: Secondary | ICD-10-CM | POA: Diagnosis present

## 2012-04-14 DIAGNOSIS — I2510 Atherosclerotic heart disease of native coronary artery without angina pectoris: Secondary | ICD-10-CM | POA: Diagnosis present

## 2012-04-14 DIAGNOSIS — M069 Rheumatoid arthritis, unspecified: Secondary | ICD-10-CM

## 2012-04-14 DIAGNOSIS — Z86711 Personal history of pulmonary embolism: Secondary | ICD-10-CM

## 2012-04-14 DIAGNOSIS — I2699 Other pulmonary embolism without acute cor pulmonale: Secondary | ICD-10-CM

## 2012-04-14 DIAGNOSIS — R197 Diarrhea, unspecified: Secondary | ICD-10-CM

## 2012-04-14 DIAGNOSIS — IMO0002 Reserved for concepts with insufficient information to code with codable children: Secondary | ICD-10-CM

## 2012-04-14 DIAGNOSIS — Z7982 Long term (current) use of aspirin: Secondary | ICD-10-CM

## 2012-04-14 DIAGNOSIS — K2211 Ulcer of esophagus with bleeding: Secondary | ICD-10-CM

## 2012-04-14 DIAGNOSIS — R400 Somnolence: Secondary | ICD-10-CM

## 2012-04-14 DIAGNOSIS — Z8042 Family history of malignant neoplasm of prostate: Secondary | ICD-10-CM

## 2012-04-14 DIAGNOSIS — Z803 Family history of malignant neoplasm of breast: Secondary | ICD-10-CM

## 2012-04-14 DIAGNOSIS — D631 Anemia in chronic kidney disease: Secondary | ICD-10-CM | POA: Diagnosis present

## 2012-04-14 DIAGNOSIS — Z66 Do not resuscitate: Secondary | ICD-10-CM | POA: Diagnosis not present

## 2012-04-14 DIAGNOSIS — Z79899 Other long term (current) drug therapy: Secondary | ICD-10-CM

## 2012-04-14 DIAGNOSIS — I129 Hypertensive chronic kidney disease with stage 1 through stage 4 chronic kidney disease, or unspecified chronic kidney disease: Secondary | ICD-10-CM | POA: Diagnosis present

## 2012-04-14 HISTORY — DX: Cardiac murmur, unspecified: R01.1

## 2012-04-14 LAB — CBC WITH DIFFERENTIAL/PLATELET
Basophils Absolute: 0 10*3/uL (ref 0.0–0.1)
Basophils Relative: 0 % (ref 0–1)
HCT: 33.1 % — ABNORMAL LOW (ref 36.0–46.0)
Lymphocytes Relative: 10 % — ABNORMAL LOW (ref 12–46)
MCHC: 31.4 g/dL (ref 30.0–36.0)
Monocytes Absolute: 0.6 10*3/uL (ref 0.1–1.0)
Neutro Abs: 6.2 10*3/uL (ref 1.7–7.7)
Platelets: 217 10*3/uL (ref 150–400)
RDW: 15.7 % — ABNORMAL HIGH (ref 11.5–15.5)
WBC: 7.6 10*3/uL (ref 4.0–10.5)

## 2012-04-14 LAB — CBC
HCT: 34.3 % — ABNORMAL LOW (ref 36.0–46.0)
Hemoglobin: 10.9 g/dL — ABNORMAL LOW (ref 12.0–15.0)
MCV: 84.3 fL (ref 78.0–100.0)
RBC: 4.07 MIL/uL (ref 3.87–5.11)
RDW: 15.7 % — ABNORMAL HIGH (ref 11.5–15.5)
WBC: 6.9 10*3/uL (ref 4.0–10.5)

## 2012-04-14 LAB — COMPREHENSIVE METABOLIC PANEL
ALT: 14 U/L (ref 0–35)
AST: 17 U/L (ref 0–37)
Albumin: 3.2 g/dL — ABNORMAL LOW (ref 3.5–5.2)
CO2: 26 mEq/L (ref 19–32)
Calcium: 9.7 mg/dL (ref 8.4–10.5)
Chloride: 101 mEq/L (ref 96–112)
Creatinine, Ser: 1.36 mg/dL — ABNORMAL HIGH (ref 0.50–1.10)
Sodium: 138 mEq/L (ref 135–145)
Total Bilirubin: 0.3 mg/dL (ref 0.3–1.2)

## 2012-04-14 LAB — PRO B NATRIURETIC PEPTIDE: Pro B Natriuretic peptide (BNP): 18791 pg/mL — ABNORMAL HIGH (ref 0–450)

## 2012-04-14 LAB — CREATININE, SERUM
GFR calc Af Amer: 35 mL/min — ABNORMAL LOW (ref >90)
GFR calc non Af Amer: 31 mL/min — ABNORMAL LOW (ref >90)

## 2012-04-14 MED ORDER — LOPERAMIDE HCL 2 MG PO CAPS: 2.0000 mg | ORAL_CAPSULE | ORAL | Status: DC | PRN

## 2012-04-14 MED ORDER — ONDANSETRON HCL 4 MG/2ML IJ SOLN
4.0000 mg | Freq: Four times a day (QID) | INTRAMUSCULAR | Status: DC | PRN
  Administered 2012-04-16: 4 mg via INTRAVENOUS
  Filled 2012-04-14: qty 2

## 2012-04-14 MED ORDER — FUROSEMIDE 10 MG/ML IJ SOLN
40.0000 mg | Freq: Once | INTRAMUSCULAR | Status: AC
  Administered 2012-04-14: 40 mg via INTRAVENOUS
  Filled 2012-04-14: qty 4

## 2012-04-14 MED ORDER — DONEPEZIL HCL 10 MG PO TABS
10.0000 mg | ORAL_TABLET | Freq: Every day | ORAL | Status: DC
  Administered 2012-04-14 – 2012-04-18 (×5): 10 mg via ORAL
  Filled 2012-04-14 (×6): qty 1

## 2012-04-14 MED ORDER — ACETAMINOPHEN 500 MG PO TABS
500.0000 mg | ORAL_TABLET | Freq: Four times a day (QID) | ORAL | Status: DC | PRN
  Filled 2012-04-14: qty 1

## 2012-04-14 MED ORDER — TRAMADOL HCL 50 MG PO TABS
50.0000 mg | ORAL_TABLET | Freq: Every evening | ORAL | Status: DC | PRN
  Filled 2012-04-14: qty 1

## 2012-04-14 MED ORDER — LEFLUNOMIDE 20 MG PO TABS
20.0000 mg | ORAL_TABLET | Freq: Every day | ORAL | Status: DC
  Administered 2012-04-15 – 2012-04-19 (×5): 20 mg via ORAL
  Filled 2012-04-14 (×6): qty 1

## 2012-04-14 MED ORDER — FUROSEMIDE 10 MG/ML IJ SOLN
40.0000 mg | Freq: Two times a day (BID) | INTRAMUSCULAR | Status: DC
  Administered 2012-04-14 – 2012-04-15 (×2): 40 mg via INTRAVENOUS
  Filled 2012-04-14 (×4): qty 4

## 2012-04-14 MED ORDER — PREDNISONE 5 MG PO TABS
7.5000 mg | ORAL_TABLET | Freq: Every day | ORAL | Status: DC
  Administered 2012-04-15 – 2012-04-19 (×5): 7.5 mg via ORAL
  Filled 2012-04-14 (×5): qty 1

## 2012-04-14 MED ORDER — ASPIRIN 81 MG PO CHEW
81.0000 mg | CHEWABLE_TABLET | Freq: Every day | ORAL | Status: DC
  Administered 2012-04-15 – 2012-04-19 (×5): 81 mg via ORAL
  Filled 2012-04-14 (×5): qty 1

## 2012-04-14 MED ORDER — ONDANSETRON HCL 4 MG PO TABS
4.0000 mg | ORAL_TABLET | Freq: Four times a day (QID) | ORAL | Status: DC | PRN
  Administered 2012-04-19: 4 mg via ORAL
  Filled 2012-04-14: qty 1

## 2012-04-14 MED ORDER — ACETAMINOPHEN 80 MG PO CHEW: 500.0000 mg | CHEWABLE_TABLET | Freq: Four times a day (QID) | ORAL | Status: DC | PRN

## 2012-04-14 MED ORDER — FAMOTIDINE 20 MG PO TABS
20.0000 mg | ORAL_TABLET | Freq: Two times a day (BID) | ORAL | Status: DC
  Administered 2012-04-14 – 2012-04-15 (×3): 20 mg via ORAL
  Filled 2012-04-14 (×5): qty 1

## 2012-04-14 MED ORDER — LEVOTHYROXINE SODIUM 125 MCG PO TABS
125.0000 ug | ORAL_TABLET | Freq: Every day | ORAL | Status: DC
  Administered 2012-04-15 – 2012-04-19 (×5): 125 ug via ORAL
  Filled 2012-04-14 (×6): qty 1

## 2012-04-14 MED ORDER — POTASSIUM CHLORIDE 20 MEQ/15ML (10%) PO LIQD
20.0000 meq | Freq: Every day | ORAL | Status: DC
  Administered 2012-04-15 – 2012-04-19 (×5): 20 meq via ORAL
  Filled 2012-04-14 (×6): qty 15

## 2012-04-14 MED ORDER — SODIUM CHLORIDE 0.9 % IJ SOLN
3.0000 mL | Freq: Two times a day (BID) | INTRAMUSCULAR | Status: DC
  Administered 2012-04-14 – 2012-04-18 (×8): 3 mL via INTRAVENOUS

## 2012-04-14 MED ORDER — LABETALOL HCL 200 MG PO TABS
200.0000 mg | ORAL_TABLET | Freq: Two times a day (BID) | ORAL | Status: DC
  Administered 2012-04-14 – 2012-04-19 (×10): 200 mg via ORAL
  Filled 2012-04-14 (×11): qty 1

## 2012-04-14 MED ORDER — ENOXAPARIN SODIUM 30 MG/0.3ML ~~LOC~~ SOLN
30.0000 mg | SUBCUTANEOUS | Status: DC
  Administered 2012-04-14 – 2012-04-18 (×5): 30 mg via SUBCUTANEOUS
  Filled 2012-04-14 (×6): qty 0.3

## 2012-04-14 MED ORDER — ENSURE COMPLETE PO LIQD
237.0000 mL | Freq: Three times a day (TID) | ORAL | Status: DC
  Administered 2012-04-15 – 2012-04-19 (×8): 237 mL via ORAL

## 2012-04-14 MED ORDER — MONTELUKAST SODIUM 10 MG PO TABS
10.0000 mg | ORAL_TABLET | Freq: Every day | ORAL | Status: DC
  Administered 2012-04-14 – 2012-04-18 (×5): 10 mg via ORAL
  Filled 2012-04-14 (×6): qty 1

## 2012-04-14 MED ORDER — VITAMIN C 500 MG PO TABS
500.0000 mg | ORAL_TABLET | Freq: Every day | ORAL | Status: DC
  Administered 2012-04-15 – 2012-04-19 (×5): 500 mg via ORAL
  Filled 2012-04-14 (×5): qty 1

## 2012-04-14 MED ORDER — ATORVASTATIN CALCIUM 20 MG PO TABS
20.0000 mg | ORAL_TABLET | Freq: Every day | ORAL | Status: DC
  Administered 2012-04-14 – 2012-04-18 (×5): 20 mg via ORAL
  Filled 2012-04-14 (×6): qty 1

## 2012-04-14 NOTE — ED Provider Notes (Signed)
History     CSN: [id number]  Arrival date & time 04/14/12  1244   First MD Initiated Contact with Patient 04/14/12 1306      Chief Complaint  Patient presents with  . Chest Pain  . Abdominal Pain    (Consider location/radiation/quality/duration/timing/severity/associated sxs/prior treatment) HPI Pt with history of CAD, CHF brought to the ED via EMS from home with family who states she was complaining of chest pain earlier today, also has had increased SOB and LE edema recently. She apparently reported abdominal pain earlier, but denies same to me and family is unaware of any complaints of abdominal pain at home. She Is not having pain at the time of my evaluation. Given ASA enroute.  Past Medical History  Diagnosis Date  . Chronic diastolic heart failure   . Rheumatoid arthritis   . Ulcer of esophagus with bleeding   . Essential hypertension, benign   . Benign neoplasm of colon   . Iron deficiency anemia secondary to blood loss (chronic)   . Syncope and collapse   . Pulmonary embolism     IVC filter  . Aortic stenosis     echo 4/11: mod LVH, mild focal basal septal hypertrophy, EF 55-60%, grade 1 diast dysfxn, critical AS, mild AI, AVA 0.6 cm2, mean gradient across AV 73 mmHg, mild MR, mild LAE, mild elevated PASP.  Marland Kitchen Musculoskeletal chest pain   . Dementia   . Hypothyroidism   . HLD (hyperlipidemia)   . GI bleed     while on coumadin  . Diverticulosis     severe in the sigmond colon  . CAD (coronary artery disease)     LHC 11/2000: EF 60%, pLAD 25%, dLAD 20%, D1 30%, mCFX 30%, pOM2 50% and lateral branch 60%, pRCA 20%.  . Pacemaker   . CHF (congestive heart failure)   . Chronic kidney disease     Past Surgical History  Procedure Date  . Ivc filter 07/2009  . Knee arthroscopy   . Cataract extraction   . Polyp removal     sessile polyp inascending colon    Family History  Problem Relation Age of Onset  . Breast cancer Daughter   . Esophageal cancer Daughter     . Prostate cancer Other     nephew  . Diabetes Daughter   . Heart disease Other     family history    History  Substance Use Topics  . Smoking status: Never Smoker   . Smokeless tobacco: Never Used  . Alcohol Use: No    OB History    Grav Para Term Preterm Abortions TAB SAB Ect Mult Living                  Review of Systems All other systems reviewed and are negative except as noted in HPI.   Allergies  Vioxx  Home Medications   Current Outpatient Rx  Name  Route  Sig  Dispense  Refill  . ACETAMINOPHEN 500 MG PO CHEW   Oral   Chew 500-1,000 mg by mouth every 6 (six) hours as needed. For pain         . ASPIRIN 81 MG PO TABS   Oral   Take 81 mg by mouth daily.         . ATORVASTATIN CALCIUM 20 MG PO TABS   Oral   Take 20 mg by mouth at bedtime.          Marland Kitchen BACITRACIN 500 UNIT/GM OP  OINT   Both Eyes   Place 1 application into both eyes 2 (two) times daily. apply to eye         . DONEPEZIL HCL 10 MG PO TABS   Oral   Take 10 mg by mouth at bedtime.         [place]olon Nations COMPLETE PO LIQD   Oral   Take 237 mLs by mouth 3 (three) times daily between meals.         . FUROSEMIDE 40 MG PO TABS   Oral   Take 1 tablet (40 mg total) by mouth daily.   30 tablet   6   . LABETALOL HCL 200 MG PO TABS   Oral   Take 1 tablet (200 mg total) by mouth 2 (two) times daily.   60 tablet   11   . LEFLUNOMIDE 20 MG PO TABS   Oral   Take 20 mg by mouth daily.           Marland Kitchen LEVOTHYROXINE SODIUM 125 MCG PO TABS   Oral   Take 125 mcg by mouth daily.         Marland Kitchen LOPERAMIDE HCL 2 MG PO CAPS   Oral   Take 2 mg by mouth as needed. For diarrhea         . MONTELUKAST SODIUM 10 MG PO TABS   Oral   Take 10 mg by mouth at bedtime.         Marland Kitchen OVER THE COUNTER MEDICATION   Oral   Take 1 tablet by mouth daily. Life Guard antioxidant therapy         . POTASSIUM CHLORIDE 20 MEQ/15ML (10%) PO LIQD   Oral   Take 10 mEq by mouth daily.         Marland Kitchen PREDNISONE 5 MG  PO TABS   Oral   Take 7.5 mg by mouth daily.          Marland Kitchen RANITIDINE HCL 150 MG PO TABS   Oral   Take 150 mg by mouth 2 (two) times daily.         . TRAMADOL HCL 50 MG PO TABS   Oral   Take 50 mg by mouth at bedtime as needed. For pain         . VITAMIN C 500 MG PO TABS   Oral   Take 500 mg by mouth daily.           Marland Kitchen VITAMIN B1-B12 IM   Intramuscular   Inject 1,000 mg into the muscle every 30 (thirty) days.           BP 128/51  Pulse 85  Temp 98.5 F (36.9 C) (Oral)  Resp 17  SpO2 98%  Physical Exam  Nursing note and vitals reviewed. Constitutional: She is oriented to person, place, and time. She appears well-developed and well-nourished.  HENT:  Head: Normocephalic and atraumatic.  Eyes: EOM are normal. Pupils are equal, round, and reactive to light.  Neck: Normal range of motion. Neck supple.  Cardiovascular: Normal rate, normal heart sounds and intact distal pulses.   Pulmonary/Chest: Effort normal and breath sounds normal.  Abdominal: Bowel sounds are normal. She exhibits no distension. There is no tenderness.  Musculoskeletal: Normal range of motion. She exhibits no edema and no tenderness.  Neurological: She is alert and oriented to person, place, and time. She has normal strength. No cranial nerve deficit or sensory deficit.  Skin: Skin is warm and dry.  No rash noted.  Psychiatric: She has a normal mood and affect.    ED Course  Procedures (including critical care time)  Labs Reviewed  CBC WITH DIFFERENTIAL - Abnormal; Notable for the following:    Hemoglobin 10.4 (*)     HCT 33.1 (*)     RDW 15.7 (*)     Neutrophils Relative 82 (*)     Lymphocytes Relative 10 (*)     All other components within normal limits  COMPREHENSIVE METABOLIC PANEL - Abnormal; Notable for the following:    Glucose, Bld 112 (*)     BUN 28 (*)     Creatinine, Ser 1.36 (*)     Albumin 3.2 (*)     GFR calc non Af Amer 33 (*)     GFR calc Af Amer 38 (*)     All other  components within normal limits  PRO B NATRIURETIC PEPTIDE - Abnormal; Notable for the following:    Pro B Natriuretic peptide (BNP) 18791.0 (*)     All other components within normal limits  TROPONIN I   Dg Chest 2 View  04/14/2012  *RADIOLOGY REPORT*  Clinical Data: Chest pain and shortness of breath.  CHEST - 2 VIEW  Comparison: Plain film of the chest 04/06/2012 and CT chest 03/22/2011.  Findings: There is marked cardiomegaly.  Pulmonary vascular congestion without frank edema is noted.  No focal airspace disease or pleural effusion.  No pneumothorax.  IVC filter is noted.  IMPRESSION: Marked cardiomegaly without edema.   Original Report Authenticated By: Holley Dexter, M.D.      No diagnosis found.    MDM   Date: 04/14/2012  Rate: 81  Rhythm: normal sinus rhythm  QRS Axis: normal  Intervals: normal  ST/T Wave abnormalities: nonspecific T wave changes  Conduction Disutrbances:right bundle branch block  Narrative Interpretation:   Old EKG Reviewed: unchanged   3:43 PM Worsening CHF by labs and symptoms. With severe aortic stenosis, will need gentle diuresis. Plan admission for further treatment. Discussed with Dr. Mitchel Honour.        Esmeralda Blanford B. Bernette Mayers, MD 04/14/12 1544

## 2012-04-14 NOTE — ED Notes (Signed)
Pt here from home via EMS with c/o right side chest pain with SOB. Reports abdominal pain and nausea. Onset 1245. EMS give baby aspirin x4. Vitals per EMS BP 127/66, HR 82, O2 100 on 2L

## 2012-04-14 NOTE — Progress Notes (Signed)
Pt admitted from ED with CHF, pt is A/O forgetful at times, pt lives with daughter who is at bedside and able to answer questions, pt stable will monitor Worthy Flank, RN

## 2012-04-14 NOTE — H&P (Signed)
[facility]          History and Physical    PCP:   Laurena Slimmer, MD   Cardiology: Dr. Tonny Bollman  Chief Complaint:  Chest pain, SoB  HPI:[name] with diastolic CHF,  critical aortic stenosis with h/o palliative balloon aortic valvuloplasty 6/13, dementia was just discharged from [facility] 11/17 after admission for CHF exacerbation secondary to her valvular disease. She is followed by hospice for her heart disease and is brought to the ER today with the above complaint. She lives at home with her daughter who reports sudden onset of chest pain, shortness of breath and diaphoresis this morning and hence brought her to the ER. She has also noticed lower extremity edema which developed since her discharge. Upon evaluation in the ER she was noted to have s/s of CHf and slight increase in BNP since discharge.   Allergies:   Allergies  Allergen Reactions  . Vioxx (Rofecoxib) Nausea And Vomiting    Unknown       Past Medical History  Diagnosis Date  . Chronic diastolic heart failure   . Rheumatoid arthritis   . Ulcer of esophagus with bleeding   . Essential hypertension, benign   . Benign neoplasm of colon   . Iron deficiency anemia secondary to blood loss (chronic)   . Syncope and collapse   . Pulmonary embolism     IVC filter  . Aortic stenosis     echo 4/11: mod LVH, mild focal basal septal hypertrophy, EF 55-60%, grade 1 diast dysfxn, critical AS, mild AI, AVA 0.6 cm2, mean gradient across AV 73 mmHg, mild MR, mild LAE, mild elevated PASP.  Marland Kitchen Musculoskeletal chest pain   . Dementia   . Hypothyroidism   . HLD (hyperlipidemia)   . GI bleed     while on coumadin  . Diverticulosis     severe in the sigmond colon  . CAD (coronary artery disease)     LHC 11/2000: EF 60%, pLAD 25%, dLAD 20%, D1 30%, mCFX 30%, pOM2 50% and lateral branch 60%, pRCA 20%.  . Pacemaker   . CHF (congestive heart failure)   . Chronic kidney disease     Past Surgical History    Procedure Date  . Ivc filter 07/2009  . Knee arthroscopy   . Cataract extraction   . Polyp removal     sessile polyp inascending colon    Prior to Admission medications   Medication Sig Start Date End Date Taking? Authorizing Provider  acetaminophen (TYLENOL) 500 MG chewable tablet Chew 500-1,000 mg by mouth every 6 (six) hours as needed. For pain   Yes Historical Provider, MD  aspirin 81 MG tablet Take 81 mg by mouth daily.   Yes Historical Provider, MD  atorvastatin (LIPITOR) 20 MG tablet Take 20 mg by mouth at bedtime.    Yes Historical Provider, MD  bacitracin ophthalmic ointment Place 1 application into both eyes 2 (two) times daily. apply to eye   Yes Historical Provider, MD  donepezil (ARICEPT) 10 MG tablet Take 10 mg by mouth at bedtime.   Yes Historical Provider, MD  feeding supplement (ENSURE COMPLETE) LIQD Take 237 mLs by mouth 3 (three) times daily between meals. 04/08/12  Yes Elease Etienne, MD  furosemide (LASIX) 40 MG tablet Take 1 tablet (40 mg total) by mouth daily. 10/20/11 10/19/12 Yes Duke Salvia, MD  labetalol (NORMODYNE) 200 MG tablet Take 1 tablet (200 mg total) by mouth 2 (two) times daily. 12/02/11  Yes Tonny Bollman, MD  leflunomide (ARAVA) 20 MG tablet Take 20 mg by mouth daily.     Yes Historical Provider, MD  levothyroxine (SYNTHROID, LEVOTHROID) 125 MCG tablet Take 125 mcg by mouth daily.   Yes Historical Provider, MD  loperamide (IMODIUM) 2 MG capsule Take 2 mg by mouth as needed. For diarrhea   Yes Historical Provider, MD  montelukast (SINGULAIR) 10 MG tablet Take 10 mg by mouth at bedtime.   Yes Historical Provider, MD  OVER THE COUNTER MEDICATION Take 1 tablet by mouth daily. Life Guard antioxidant therapy   Yes Historical Provider, MD  potassium chloride 20 MEQ/15ML (10%) solution Take 10 mEq by mouth daily.   Yes Historical Provider, MD  predniSONE (DELTASONE) 5 MG tablet Take 7.5 mg by mouth daily.    Yes Historical Provider, MD  ranitidine (ZANTAC)  150 MG tablet Take 150 mg by mouth 2 (two) times daily.   Yes Historical Provider, MD  traMADol (ULTRAM) 50 MG tablet Take 50 mg by mouth at bedtime as needed. For pain   Yes Historical Provider, MD  vitamin C (ASCORBIC ACID) 500 MG tablet Take 500 mg by mouth daily.     Yes Historical Provider, MD  VITAMIN B1-B12 IM Inject 1,000 mg into the muscle every 30 (thirty) days.    Historical Provider, MD    Social History:  reports that she has never smoked. She has never used smokeless tobacco. She reports that she does not drink alcohol or use illicit drugs.  Family History  Problem Relation Age of Onset  . Breast cancer Daughter   . Esophageal cancer Daughter   . Prostate cancer Other     nephew  . Diabetes Daughter   . Heart disease Other     family history    Review of Systems:  Constitutional: Denies fever, chills, diaphoresis, appetite change and fatigue.  HEENT: Denies photophobia, eye pain, redness, hearing loss, ear pain, congestion, sore throat, rhinorrhea, sneezing, mouth sores, trouble swallowing, neck pain, neck stiffness and tinnitus.   Respiratory: Denies SOB, DOE, cough, chest tightness,  and wheezing.   Cardiovascular: Denies chest pain, palpitations and leg swelling.  Gastrointestinal: Denies nausea, vomiting, abdominal pain, diarrhea, constipation, blood in stool and abdominal distention.  Genitourinary: Denies dysuria, urgency, frequency, hematuria, flank pain and difficulty urinating.  Musculoskeletal: Denies myalgias, back pain, joint swelling, arthralgias and gait problem.  Skin: Denies pallor, rash and wound.  Neurological: Denies dizziness, seizures, syncope, weakness, light-headedness, numbness and headaches.  Hematological: Denies adenopathy. Easy bruising, personal or family bleeding history  Psychiatric/Behavioral: Denies suicidal ideation, mood changes, confusion, nervousness, sleep disturbance and agitation   Physical Exam: Blood pressure 145/50, pulse 76,  temperature 98 F (36.7 C), temperature source Oral, resp. rate 19, SpO2 97.00%. Pt is somnolent but easily arousable. She answers questions appropriately but she is not oriented to time. No distress HEENT: normal  Neck: JVP - normal, carotids 2+  Lungs: fine bibasilar crackles CV: RRR with a grade 4/6 crescendo decrescendo systolic murmur at the left sternal border  Abd: soft, NT, Positive BS, no hepatomegaly  Ext: 1 plus edema B/L  Skin: warm/dry no rash      Labs on Admission:  Results for orders placed during the hospital encounter of 04/14/12 (from the past 48 hour(s))  CBC WITH DIFFERENTIAL     Status: Abnormal   Collection Time   04/14/12 12:32 PM      Component Value Range Comment   WBC 7.6  4.0 -  10.5 K/uL    RBC 3.94  3.87 - 5.11 MIL/uL    Hemoglobin 10.4 (*) 12.0 - 15.0 g/dL    HCT 13.0 (*) 86.5 - 46.0 %    MCV 84.0  78.0 - 100.0 fL    MCH 26.4  26.0 - 34.0 pg    MCHC 31.4  30.0 - 36.0 g/dL    RDW 78.4 (*) 69.6 - 15.5 %    Platelets 217  150 - 400 K/uL    Neutrophils Relative 82 (*) 43 - 77 %    Neutro Abs 6.2  1.7 - 7.7 K/uL    Lymphocytes Relative 10 (*) 12 - 46 %    Lymphs Abs 0.7  0.7 - 4.0 K/uL    Monocytes Relative 8  3 - 12 %    Monocytes Absolute 0.6  0.1 - 1.0 K/uL    Eosinophils Relative 0  0 - 5 %    Eosinophils Absolute 0.0  0.0 - 0.7 K/uL    Basophils Relative 0  0 - 1 %    Basophils Absolute 0.0  0.0 - 0.1 K/uL   COMPREHENSIVE METABOLIC PANEL     Status: Abnormal   Collection Time   04/14/12 12:32 PM      Component Value Range Comment   Sodium 138  135 - 145 mEq/L    Potassium 4.7  3.5 - 5.1 mEq/L    Chloride 101  96 - 112 mEq/L    CO2 26  19 - 32 mEq/L    Glucose, Bld 112 (*) 70 - 99 mg/dL    BUN 28 (*) 6 - 23 mg/dL    Creatinine, Ser 2.95 (*) 0.50 - 1.10 mg/dL    Calcium 9.7  8.4 - 28.4 mg/dL    Total Protein 6.7  6.0 - 8.3 g/dL    Albumin 3.2 (*) 3.5 - 5.2 g/dL    AST 17  0 - 37 U/L    ALT 14  0 - 35 U/L    Alkaline Phosphatase 95  39  - 117 U/L    Total Bilirubin 0.3  0.3 - 1.2 mg/dL    GFR calc non Af Amer 33 (*) >90 mL/min    GFR calc Af Amer 38 (*) >90 mL/min   TROPONIN I     Status: Normal   Collection Time   04/14/12 12:33 PM      Component Value Range Comment   Troponin I <0.30  <0.30 ng/mL   PRO B NATRIURETIC PEPTIDE     Status: Abnormal   Collection Time   04/14/12 12:33 PM      Component Value Range Comment   Pro B Natriuretic peptide (BNP) 18791.0 (*) 0 - 450 pg/mL     Radiological Exams on Admission: Dg Chest 2 View  04/14/2012  *RADIOLOGY REPORT*  Clinical Data: Chest pain and shortness of breath.  CHEST - 2 VIEW  Comparison: Plain film of the chest 04/06/2012 and CT chest 03/22/2011.  Findings: There is marked cardiomegaly.  Pulmonary vascular congestion without frank edema is noted.  No focal airspace disease or pleural effusion.  No pneumothorax.  IVC filter is noted.  IMPRESSION: Marked cardiomegaly without edema.   Original Report Authenticated By: Holley Dexter, M.D.     Assessment/Plan  1. Acute on chronic diastolic CHF/Critical AS followed by Hospice Had palliative balloon valvuloplasty in 6/13, with only small improvement in gradient. Quick readmission in few days Diurese gently with IV lasix I/Os, daily weights EKG unchanged  Not a candidate for aggressive management  2. DEMENTIA: stable  3. RA: continue arava and prednisone  4. Anemia of chronic disease: stable  5. CKD 3: creatinine almost at baseline, monitor with diuresis  CODE STATUS: discussed this at length with daughter Shevonne Wolf who despite much discussion wants her mother to be a full code till thanksgiving when the whole family will have a chance to visit her after that is ok with DNR Family contact: daughter Kimie Pidcock at bedside Disposition: inpatient  Time Spent on Admission:  [facility] Pager: [phone] 04/14/2012, 4:17 PM

## 2012-04-15 LAB — BASIC METABOLIC PANEL
Chloride: 99 mEq/L (ref 96–112)
GFR calc non Af Amer: 30 mL/min — ABNORMAL LOW (ref >90)
Glucose, Bld: 93 mg/dL (ref 70–99)
Potassium: 4.5 mEq/L (ref 3.5–5.1)
Sodium: 140 mEq/L (ref 135–145)

## 2012-04-15 LAB — CBC
Hemoglobin: 10.5 g/dL — ABNORMAL LOW (ref 12.0–15.0)
MCHC: 31.7 g/dL (ref 30.0–36.0)
RBC: 3.98 MIL/uL (ref 3.87–5.11)
WBC: 5.6 10*3/uL (ref 4.0–10.5)

## 2012-04-15 MED ORDER — FUROSEMIDE 10 MG/ML IJ SOLN
40.0000 mg | Freq: Every day | INTRAMUSCULAR | Status: DC
  Administered 2012-04-16 – 2012-04-17 (×2): 40 mg via INTRAVENOUS
  Filled 2012-04-15 (×2): qty 4

## 2012-04-15 NOTE — Progress Notes (Signed)
[facility]             Progress Note   Subjective: Feels ok, breathing fine  Objective: Vital signs in last 24 hours: Temp:  [98 F (36.7 C)-98.5 F (36.9 C)] 98.3 F (36.8 C) (11/24 0726) Pulse Rate:  [69-85] 71  (11/24 0726) Resp:  [17-24] 20  (11/24 0726) BP: (122-154)/(47-94) 137/51 mmHg (11/24 0726) SpO2:  [97 %-100 %] 100 % (11/24 0726) Weight:  [62.1 kg (136 lb 14.5 oz)-64.048 kg (141 lb 3.2 oz)] 62.1 kg (136 lb 14.5 oz) (11/24 0726) Weight change:  Last BM Date: 04/14/12  Intake/Output from previous day:       Physical Exam: General: Alert, awake, oriented x3, in no acute distress. HEENT: No bruits, no goiter. Heart: Regular rate and rhythm, SEM 4/6, rubs, gallops. Lungs: bibasilar crackles. Abdomen: Soft, nontender, nondistended, positive bowel sounds. Extremities: trace edema b/l, No clubbing cyanosis with positive pedal pulses. Neuro: Grossly intact, nonfocal.    Lab Results: Basic Metabolic Panel:  Basename 04/14/12 1840 04/14/12 1232  NA -- 138  K -- 4.7  CL -- 101  CO2 -- 26  GLUCOSE -- 112*  BUN -- 28*  CREATININE 1.44* 1.36*  CALCIUM -- 9.7  MG -- --  PHOS -- --   Liver Function Tests:  [facility] 04/14/12 1232  AST 17  ALT 14  ALKPHOS 95  BILITOT 0.3  PROT 6.7  ALBUMIN 3.2*   No results found for this basename: LIPASE:2,AMYLASE:2 in the last 72 hours No results found for this basename: AMMONIA:2 in the last 72 hours CBC:  Basename 04/15/12 0644 04/14/12 1840 04/14/12 1232  WBC 5.6 6.9 --  NEUTROABS -- -- 6.2  HGB 10.5* 10.9* --  HCT 33.1* 34.3* --  MCV 83.2 84.3 --  PLT PENDING 226 --   Cardiac Enzymes:  Basename 04/14/12 1233  CKTOTAL --  CKMB --  CKMBINDEX --  TROPONINI <0.30   BNP:  Basename 04/14/12 1233  PROBNP 18791.0*   D-Dimer: No results found for this basename: DDIMER:2 in the last 72 hours CBG: No results found for this basename: GLUCAP:6 in the last 72 hours Hemoglobin A1C: No  results found for this basename: HGBA1C in the last 72 hours Fasting Lipid Panel: No results found for this basename: CHOL,HDL,LDLCALC,TRIG,CHOLHDL,LDLDIRECT in the last 72 hours Thyroid Function Tests: No results found for this basename: TSH,T4TOTAL,FREET4,T3FREE,THYROIDAB in the last 72 hours Anemia Panel: No results found for this basename: VITAMINB12,FOLATE,FERRITIN,TIBC,IRON,RETICCTPCT in the last 72 hours Coagulation: No results found for this basename: LABPROT:2,INR:2 in the last 72 hours Urine Drug Screen: Drugs of Abuse  No results found for this basename: labopia, cocainscrnur, labbenz, amphetmu, thcu, labbarb    Alcohol Level: No results found for this basename: ETH:2 in the last 72 hours Urinalysis: No results found for this basename: COLORURINE:2,APPERANCEUR:2,LABSPEC:2,PHURINE:2,GLUCOSEU:2,HGBUR:2,BILIRUBINUR:2,KETONESUR:2,PROTEINUR:2,UROBILINOGEN:2,NITRITE:2,LEUKOCYTESUR:2 in the last 72 hours  No results found for this or any previous visit (from the past 240 hour(s)).  Studies/Results: Dg Chest 2 View  04/14/2012  *RADIOLOGY REPORT*  Clinical Data: Chest pain and shortness of breath.  CHEST - 2 VIEW  Comparison: Plain film of the chest 04/06/2012 and CT chest 03/22/2011.  Findings: There is marked cardiomegaly.  Pulmonary vascular congestion without frank edema is noted.  No focal airspace disease or pleural effusion.  No pneumothorax.  IVC filter is noted.  IMPRESSION: Marked cardiomegaly without edema.   Original Report Authenticated By: Holley Dexter, M.D.     Medications: Scheduled Meds:   . aspirin  81 mg Oral Daily  . atorvastatin  20 mg Oral QHS  . donepezil  10 mg Oral QHS  . enoxaparin (LOVENOX) injection  30 mg Subcutaneous Q24H  . famotidine  20 mg Oral BID  . feeding supplement  237 mL Oral TID BM  . [COMPLETED] furosemide  40 mg Intravenous Once  . furosemide  40 mg Intravenous Daily  . labetalol  200 mg Oral BID  . leflunomide  20 mg Oral Daily    . levothyroxine  125 mcg Oral QAC breakfast  . montelukast  10 mg Oral QHS  . potassium chloride  20 mEq Oral Daily  . predniSONE  7.5 mg Oral Daily  . sodium chloride  3 mL Intravenous Q12H  . vitamin C  500 mg Oral Daily  . [DISCONTINUED] furosemide  40 mg Intravenous Q12H   Continuous Infusions:  PRN Meds:.acetaminophen, loperamide, ondansetron (ZOFRAN) IV, ondansetron, traMADol, [DISCONTINUED] acetaminophen  Assessment/Plan: 1. Acute on chronic diastolic CHF/Critical AS followed by Hospice  Had palliative balloon valvuloplasty in 6/13, with only small improvement in gradient.  Quick readmission in few days, unclear if dietary indiscretion or med compliance is the issue  Diurese gently with IV lasix , change to 40mg  daily incontinent of urine FU daily weights  EKG unchanged  Not a candidate for aggressive management   2. DEMENTIA: stable   3. RA: continue arava and prednisone   4. Anemia of chronic disease: stable   5. CKD 3: slight bump in creatinine, cut down diuretics   CODE STATUS: discussed this at length with daughter Dennie Snow who despite much discussion wants her mother to be a full code till thanksgiving when the whole family will have a chance to visit her after that is ok with DNR  Family contact: daughter Audrena Heydon at bedside  Disposition: inpatient, home in [place] if stable      Time spent coordinating care:   LOS: 1 day   [facility] Pager: [phone] 04/15/2012, 8:20 AM

## 2012-04-15 NOTE — Progress Notes (Signed)
Utilization Review Completed.[name] T11/24/2013   

## 2012-04-16 DIAGNOSIS — I5032 Chronic diastolic (congestive) heart failure: Secondary | ICD-10-CM

## 2012-04-16 LAB — BASIC METABOLIC PANEL
CO2: 31 mEq/L (ref 19–32)
Chloride: 102 mEq/L (ref 96–112)
Glucose, Bld: 99 mg/dL (ref 70–99)
Potassium: 3.8 mEq/L (ref 3.5–5.1)
Sodium: 143 mEq/L (ref 135–145)

## 2012-04-16 LAB — URINALYSIS, ROUTINE W REFLEX MICROSCOPIC
Glucose, UA: NEGATIVE mg/dL
Protein, ur: NEGATIVE mg/dL
Specific Gravity, Urine: 1.01 (ref 1.005–1.030)
pH: 8 (ref 5.0–8.0)

## 2012-04-16 LAB — URINE MICROSCOPIC-ADD ON

## 2012-04-16 MED ORDER — FAMOTIDINE 20 MG PO TABS
20.0000 mg | ORAL_TABLET | Freq: Every day | ORAL | Status: DC
  Administered 2012-04-16 – 2012-04-18 (×3): 20 mg via ORAL
  Filled 2012-04-16 (×4): qty 1

## 2012-04-16 NOTE — Progress Notes (Signed)
04/16/2012 Pharmacy: dose adjustment Pepcid 20 mg po BID.  Creat Cl ~ 22 ml/min. Dose adjusted to 20 mg po qhs for creat cl < 30 ml/min. Herby Abraham, Pharm.D. [phone] 04/16/2012 8:18 AM

## 2012-04-16 NOTE — Plan of Care (Signed)
Problem: Phase I Progression Outcomes Goal: EF % per last Echo/documented,Core Reminder form on chart Outcome: Completed/Met Date Met:  04/16/12 EF for June 2013 is 60-65%

## 2012-04-16 NOTE — Progress Notes (Signed)
[facility]             Progress Note   Subjective: Feels ok, breathing fine  Objective: Vital signs in last 24 hours: Temp:  [98 F (36.7 C)-99.1 F (37.3 C)] 98 F (36.7 C) (11/25 0521) Pulse Rate:  [69-77] 75  (11/25 0854) Resp:  [20] 20  (11/25 0521) BP: (127-142)/(40-51) 136/40 mmHg (11/25 0854) SpO2:  [96 %-100 %] 96 % (11/25 0521) Weight:  [62.823 kg (138 lb 8 oz)] 62.823 kg (138 lb 8 oz) (11/25 0521) Weight change: -1.948 kg (-4 lb 4.7 oz) Last BM Date: 04/15/12  Intake/Output from previous day: 11/24 0701 - 11/25 0700 In: 255 [P.O.:255] Out: 75 [Urine:75] Total I/O In: 120 [P.O.:120] Out: -    Physical Exam: General: Alert, awake, oriented x3, in no acute distress. HEENT: No bruits, no goiter. Heart: Regular rate and rhythm, SEM 4/6, rubs, gallops. Lungs: bibasilar crackles. Abdomen: Soft, nontender, nondistended, positive bowel sounds. Extremities: trace edema b/l, No clubbing cyanosis with positive pedal pulses. Neuro: Grossly intact, nonfocal.    Lab Results: Basic Metabolic Panel:  Basename 04/16/12 0614 04/15/12 0644  NA 143 140  K 3.8 4.5  CL 102 99  CO2 31 28  GLUCOSE 99 93  BUN 28* 29*  CREATININE 1.59* 1.46*  CALCIUM 9.6 9.6  MG -- --  PHOS -- --   Liver Function Tests:  [facility] 04/14/12 1232  AST 17  ALT 14  ALKPHOS 95  BILITOT 0.3  PROT 6.7  ALBUMIN 3.2*   No results found for this basename: LIPASE:2,AMYLASE:2 in the last 72 hours No results found for this basename: AMMONIA:2 in the last 72 hours CBC:  Basename 04/15/12 0644 04/14/12 1840 04/14/12 1232  WBC 5.6 6.9 --  NEUTROABS -- -- 6.2  HGB 10.5* 10.9* --  HCT 33.1* 34.3* --  MCV 83.2 84.3 --  PLT 133* 226 --   Cardiac Enzymes:  Basename 04/14/12 1233  CKTOTAL --  CKMB --  CKMBINDEX --  TROPONINI <0.30   BNP:  Basename 04/14/12 1233  PROBNP 18791.0*   D-Dimer: No results found for this basename: DDIMER:2 in the last 72 hours CBG: No  results found for this basename: GLUCAP:6 in the last 72 hours Hemoglobin A1C: No results found for this basename: HGBA1C in the last 72 hours Fasting Lipid Panel: No results found for this basename: CHOL,HDL,LDLCALC,TRIG,CHOLHDL,LDLDIRECT in the last 72 hours Thyroid Function Tests: No results found for this basename: TSH,T4TOTAL,FREET4,T3FREE,THYROIDAB in the last 72 hours Anemia Panel: No results found for this basename: VITAMINB12,FOLATE,FERRITIN,TIBC,IRON,RETICCTPCT in the last 72 hours Coagulation: No results found for this basename: LABPROT:2,INR:2 in the last 72 hours Urine Drug Screen: Drugs of Abuse  No results found for this basename: labopia,  cocainscrnur,  labbenz,  amphetmu,  thcu,  labbarb    Alcohol Level: No results found for this basename: ETH:2 in the last 72 hours Urinalysis: No results found for this basename: COLORURINE:2,APPERANCEUR:2,LABSPEC:2,PHURINE:2,GLUCOSEU:2,HGBUR:2,BILIRUBINUR:2,KETONESUR:2,PROTEINUR:2,UROBILINOGEN:2,NITRITE:2,LEUKOCYTESUR:2 in the last 72 hours  No results found for this or any previous visit (from the past 240 hour(s)).  Studies/Results: Dg Chest 2 View  04/14/2012  *RADIOLOGY REPORT*  Clinical Data: Chest pain and shortness of breath.  CHEST - 2 VIEW  Comparison: Plain film of the chest 04/06/2012 and CT chest 03/22/2011.  Findings: There is marked cardiomegaly.  Pulmonary vascular congestion without frank edema is noted.  No focal airspace disease or pleural effusion.  No pneumothorax.  IVC filter is noted.  IMPRESSION: Marked cardiomegaly without edema.  Original Report Authenticated By: Holley Dexter, M.D.     Medications: Scheduled Meds:    . aspirin  81 mg Oral Daily  . atorvastatin  20 mg Oral QHS  . donepezil  10 mg Oral QHS  . enoxaparin (LOVENOX) injection  30 mg Subcutaneous Q24H  . famotidine  20 mg Oral QHS  . feeding supplement  237 mL Oral TID BM  . furosemide  40 mg Intravenous Daily  . labetalol  200 mg  Oral BID  . leflunomide  20 mg Oral Daily  . levothyroxine  125 mcg Oral QAC breakfast  . montelukast  10 mg Oral QHS  . potassium chloride  20 mEq Oral Daily  . predniSONE  7.5 mg Oral Daily  . sodium chloride  3 mL Intravenous Q12H  . vitamin C  500 mg Oral Daily  . [DISCONTINUED] famotidine  20 mg Oral BID   Continuous Infusions:  PRN Meds:.acetaminophen, loperamide, ondansetron (ZOFRAN) IV, ondansetron, traMADol  Assessment/Plan: 1. Acute on chronic diastolic CHF/Critical AS followed by Hospice  Had palliative balloon valvuloplasty in 6/13, with only small improvement in gradient.  Quick readmission in few days, unclear if dietary indiscretion or med compliance is the issue  Diurese gently with IV lasix , changed to 40mg  daily incontinent of urine FU daily weights  EKG unchanged  Not a candidate for aggressive management   2. DEMENTIA: stable   3. RA: continue arava and prednisone   4. Anemia of chronic disease: stable   5. CKD 3: slight bump in creatinine, cut down diuretics   CODE STATUS: discussed this at length with daughter Brienna Biesecker who despite much discussion wants her mother to be a full code till thanksgiving when the whole family will have a chance to visit her after that is ok with DNR  Family contact: daughter Haruna Zaya at bedside  Disposition: inpatient, home tomorrow if stable      Time spent coordinating care:   LOS: 2 days   [facility] Pager: [phone] 04/16/2012, 9:58 AM

## 2012-04-16 NOTE — Telephone Encounter (Signed)
Pt admitted to the hospital

## 2012-04-16 NOTE — Progress Notes (Signed)
INITIAL ADULT NUTRITION ASSESSMENT Date: 04/16/2012   Time: 1:56 PM Reason for Assessment: MST (Malnutrition Screening Tool)   INTERVENTION: 1. Continue Ensure Complete as ordered.   2. RD will continue to follow     DOCUMENTATION CODES Per approved criteria  -Severe malnutrition in the context of acute illness or injury     ASSESSMENT: [name]  Dx: Chest pain, SOB  Hx:  Past Medical History  Diagnosis Date  . Chronic diastolic heart failure   . Rheumatoid arthritis   . Ulcer of esophagus with bleeding   . Essential hypertension, benign   . Benign neoplasm of colon   . Iron deficiency anemia secondary to blood loss (chronic)   . Syncope and collapse   . Pulmonary embolism     IVC filter  . Aortic stenosis     echo 4/11: mod LVH, mild focal basal septal hypertrophy, EF 55-60%, grade 1 diast dysfxn, critical AS, mild AI, AVA 0.6 cm2, mean gradient across AV 73 mmHg, mild MR, mild LAE, mild elevated PASP.  Marland Kitchen Musculoskeletal chest pain   . Dementia   . Hypothyroidism   . HLD (hyperlipidemia)   . GI bleed     while on coumadin  . Diverticulosis     severe in the sigmond colon  . CAD (coronary artery disease)     LHC 11/2000: EF 60%, pLAD 25%, dLAD 20%, D1 30%, mCFX 30%, pOM2 50% and lateral branch 60%, pRCA 20%.  . Pacemaker   . CHF (congestive heart failure)   . Chronic kidney disease   . Heart murmur     Past Surgical History  Procedure Date  . Ivc filter 07/2009  . Knee arthroscopy   . Cataract extraction   . Polyp removal     sessile polyp inascending colon  . Joint replacement     bilat knee replacement    Related Meds:     . aspirin  81 mg Oral Daily  . atorvastatin  20 mg Oral QHS  . donepezil  10 mg Oral QHS  . enoxaparin (LOVENOX) injection  30 mg Subcutaneous Q24H  . famotidine  20 mg Oral QHS  . feeding supplement  237 mL Oral TID BM  . furosemide  40 mg Intravenous Daily  . labetalol  200 mg Oral BID  . leflunomide  20 mg Oral  Daily  . levothyroxine  125 mcg Oral QAC breakfast  . montelukast  10 mg Oral QHS  . potassium chloride  20 mEq Oral Daily  . predniSONE  7.5 mg Oral Daily  . sodium chloride  3 mL Intravenous Q12H  . vitamin C  500 mg Oral Daily  . [DISCONTINUED] famotidine  20 mg Oral BID     Ht: 5\' 7"  (170.2 cm)  Wt: 138 lb 8 oz (62.823 kg) (bed scale)  Ideal Wt: 61.4 kg  % Ideal Wt: 101%  Usual Wt:  Wt Readings from Last 5 Encounters:  04/16/12 138 lb 8 oz (62.823 kg)  04/08/12 143 lb 4.8 oz (65 kg)  03/02/12 152 lb (68.947 kg)  12/02/11 160 lb (72.576 kg)  11/19/11 152 lb 12.8 oz (69.31 kg)  ~160 lbs per previous notes   % Usual Wt: 86%  Body mass index is 21.69 kg/(m^2). WNL   Food/Nutrition Related Hx: poor intake and weight loss PTA  Labs:  CMP     Component Value Date/Time   NA 143 04/16/2012 0614   K 3.8 04/16/2012 0614   CL 102  04/16/2012 0614   CO2 31 04/16/2012 0614   GLUCOSE 99 04/16/2012 0614   BUN 28* 04/16/2012 0614   CREATININE 1.59* 04/16/2012 0614   CALCIUM 9.6 04/16/2012 0614   PROT 6.7 04/14/2012 1232   ALBUMIN 3.2* 04/14/2012 1232   AST 17 04/14/2012 1232   ALT 14 04/14/2012 1232   ALKPHOS 95 04/14/2012 1232   BILITOT 0.3 04/14/2012 1232   GFRNONAA 27* 04/16/2012 0614   GFRAA 31* 04/16/2012 0614      Intake/Output Summary (Last 24 hours) at 04/16/12 1358 Last data filed at 04/16/12 1329  Gross per 24 hour  Intake    363 ml  Output     75 ml  Net    288 ml     Diet Order: Dysphagia 1, thin liquids  Supplements/Tube Feeding:  Ensure Complete TID  IVF:    Estimated Nutritional Needs:   Kcal: 1500-1650 Protein:  65-78 gm Fluid:  1.5-1.6 L   Pt was just d/c'd on 11/17. Pt is followed by hospice. Admitted with SOB and chest pain. Per notes, family requesting pt be full code through the holidays.  Pt with a hx of weight loss. Current weight is down another 5 lbs since d/c. (3.4% body weight in 1 week, severe weight loss).  At previous  admission pt was dx with severe malnutrition in the context of acute illness or injury. This continues and likely r/t chronic illness.   Continue supplements as ordered.   NUTRITION DIAGNOSIS: Inadequate oral intake r/t weakness, poor appetite AEB weight loss since last admission  MONITORING/EVALUATION(Goals): Goal: PO intake as a able  Monitor: PO intake, weight, l  EDUCATION NEEDS: -No education needs identified at this time   Clarene Duke RD, LDN Pager [phone] After Hours pager [phone]  04/16/2012, 1:56 PM

## 2012-04-17 LAB — BASIC METABOLIC PANEL
BUN: 28 mg/dL — ABNORMAL HIGH (ref 6–23)
GFR calc Af Amer: 31 mL/min — ABNORMAL LOW (ref >90)
GFR calc non Af Amer: 26 mL/min — ABNORMAL LOW (ref >90)
Potassium: 4.8 mEq/L (ref 3.5–5.1)

## 2012-04-17 MED ORDER — DEXTROSE 5 % IV SOLN
1.0000 g | INTRAVENOUS | Status: DC
  Administered 2012-04-17: 1 g via INTRAVENOUS
  Filled 2012-04-17 (×3): qty 10

## 2012-04-17 MED ORDER — FUROSEMIDE 40 MG PO TABS
40.0000 mg | ORAL_TABLET | Freq: Every day | ORAL | Status: DC
  Administered 2012-04-18 – 2012-04-19 (×2): 40 mg via ORAL
  Filled 2012-04-17 (×2): qty 1

## 2012-04-17 NOTE — Progress Notes (Signed)
Patient had a 5 beat run of V-Tach. No signs or symptoms of distress. No complaints. MD made aware. New orders given. BP is 130/45 and Heart rate is 74. Will continue to monitor patient for further changes in condition.

## 2012-04-17 NOTE — Progress Notes (Signed)
[facility]: Sw note:  Chart reviewed, family did not notify [facility] that Pt in the hospital, hospice Rn found out when attempting to schedule a home visit. Pt has been under hospice services for about 2 months, has gone to Adult daycare Mon/Tues/Thurs/Fri, and has CAP worker at home to assist Pt in getting ready for daycare in the mornings and also to stay with Pt at home on Weds. Pt lives with daughter. Pt has only used hospice Rn and Sw services since being under hospice care. No family at bedside, Pt denied any pain or concerns stated "I am just resting". After Pt's last hospital admission, earlier in Nov 2013, daughter had declined any discussion of other care options for Pt stating "I want my mother at home with me." Sw provided supportive presence to Pt during visit. Sw will continue to follow for support during hospital stay.  Lorain Childes, LCSW, ACHP-SW; [facility] # [phone]

## 2012-04-17 NOTE — Progress Notes (Addendum)
[facility]             Progress Note   Subjective: I dont feel good today, complains of shortness of breath  Objective: Vital signs in last 24 hours: Temp:  [97.7 F (36.5 C)-98.3 F (36.8 C)] 97.7 F (36.5 C) (11/26 1350) Pulse Rate:  [68-82] 74  (11/26 1525) Resp:  [18] 18  (11/26 1350) BP: (119-134)/(43-53) 130/45 mmHg (11/26 1525) SpO2:  [96 %-100 %] 100 % (11/26 1350) Weight:  [61.8 kg (136 lb 3.9 oz)] 61.8 kg (136 lb 3.9 oz) (11/26 0454) Weight change: -0.3 kg (-10.6 oz) Last BM Date: 04/15/12  Intake/Output from previous day: 11/25 0701 - 11/26 0700 In: 680 [P.O.:677; I.V.:3] Out: -  Total I/O In: 183 [P.O.:180; I.V.:3] Out: -    Physical Exam: General: Alert, awake, oriented x to self and place, in no acute distress. HEENT: No bruits, no goiter. Heart: Regular rate and rhythm, SEM 4/6, rubs, gallops. Lungs: bibasilar crackles. Abdomen: Soft, nontender, nondistended, positive bowel sounds. Extremities: trace edema b/l, No clubbing cyanosis with positive pedal pulses. Neuro: Grossly intact, nonfocal.    Lab Results: Basic Metabolic Panel:  Basename 04/16/12 0614 04/15/12 0644  NA 143 140  K 3.8 4.5  CL 102 99  CO2 31 28  GLUCOSE 99 93  BUN 28* 29*  CREATININE 1.59* 1.46*  CALCIUM 9.6 9.6  MG -- --  PHOS -- --   Liver Function Tests: No results found for this basename: AST:2,ALT:2,ALKPHOS:2,BILITOT:2,PROT:2,ALBUMIN:2 in the last 72 hours No results found for this basename: LIPASE:2,AMYLASE:2 in the last 72 hours No results found for this basename: AMMONIA:2 in the last 72 hours CBC:  Basename 04/15/12 0644 04/14/12 1840  WBC 5.6 6.9  NEUTROABS -- --  HGB 10.5* 10.9*  HCT 33.1* 34.3*  MCV 83.2 84.3  PLT 133* 226   Cardiac Enzymes: No results found for this basename: CKTOTAL:3,CKMB:3,CKMBINDEX:3,TROPONINI:3 in the last 72 hours BNP: No results found for this basename: PROBNP:3 in the last 72 hours D-Dimer: No results found  for this basename: DDIMER:2 in the last 72 hours CBG: No results found for this basename: GLUCAP:6 in the last 72 hours Hemoglobin A1C: No results found for this basename: HGBA1C in the last 72 hours Fasting Lipid Panel: No results found for this basename: CHOL,HDL,LDLCALC,TRIG,CHOLHDL,LDLDIRECT in the last 72 hours Thyroid Function Tests: No results found for this basename: TSH,T4TOTAL,FREET4,T3FREE,THYROIDAB in the last 72 hours Anemia Panel: No results found for this basename: VITAMINB12,FOLATE,FERRITIN,TIBC,IRON,RETICCTPCT in the last 72 hours Coagulation: No results found for this basename: LABPROT:2,INR:2 in the last 72 hours Urine Drug Screen: Drugs of Abuse  No results found for this basename: labopia,  cocainscrnur,  labbenz,  amphetmu,  thcu,  labbarb    Alcohol Level: No results found for this basename: ETH:2 in the last 72 hours Urinalysis:  Basename 04/16/12 1618  COLORURINE YELLOW  LABSPEC 1.010  PHURINE 8.0  GLUCOSEU NEGATIVE  HGBUR TRACE*  BILIRUBINUR NEGATIVE  KETONESUR NEGATIVE  PROTEINUR NEGATIVE  UROBILINOGEN 1.0  NITRITE POSITIVE*  LEUKOCYTESUR MODERATE*    No results found for this or any previous visit (from the past 240 hour(s)).  Studies/Results: No results found.  Medications: Scheduled Meds:    . aspirin  81 mg Oral Daily  . atorvastatin  20 mg Oral QHS  . cefTRIAXone (ROCEPHIN)  IV  1 g Intravenous Q24H  . donepezil  10 mg Oral QHS  . enoxaparin (LOVENOX) injection  30 mg Subcutaneous Q24H  . famotidine  20  mg Oral QHS  . feeding supplement  237 mL Oral TID BM  . furosemide  40 mg Intravenous Daily  . labetalol  200 mg Oral BID  . leflunomide  20 mg Oral Daily  . levothyroxine  125 mcg Oral QAC breakfast  . montelukast  10 mg Oral QHS  . potassium chloride  20 mEq Oral Daily  . predniSONE  7.5 mg Oral Daily  . sodium chloride  3 mL Intravenous Q12H  . vitamin C  500 mg Oral Daily   Continuous Infusions:  PRN  Meds:.acetaminophen, loperamide, ondansetron (ZOFRAN) IV, ondansetron, traMADol  Assessment/Plan: 1. Acute on chronic diastolic CHF/Critical AS followed by Hospice  Had palliative balloon valvuloplasty in 6/13, with only small improvement in gradient.  ECHO with EF of 60% (6/13) Quick readmission in few days, unclear if dietary indiscretion or med compliance is the issue  Diurese gently with IV lasix , changed to 40mg  daily incontinent of urine I/O unreliable FU daily weights, weight down 5 lbs EKG unchanged  Not a candidate for aggressive management   2. DEMENTIA: stable   3. RA: continue arava and prednisone   4. Anemia of chronic disease: stable   5. CKD 3: slight bump in creatinine, cut down diuretics  6. Severe protein calorie malnutrition   CODE STATUS: discussed this at length with daughter Caiya Bettes who despite much discussion wants her mother to be a full code till thanksgiving when the whole family will have a chance to visit her after that is ok with DNR  Family contact: daughter Raesha Coonrod at bedside  Disposition: inpatient, home tomorrow if stable Family contact: daughter Daija Routson Dispo: Home with daughter/hospice tomorrow if symptomatically better    Time spent coordinating care:   LOS: 3 days   [facility] Pager: [phone] 04/17/2012, 4:01 PM

## 2012-04-17 NOTE — Clinical Documentation Improvement (Signed)
MALNUTRITION DOCUMENTATION CLARIFICATION  THIS DOCUMENT IS NOT A PERMANENT PART OF THE MEDICAL RECORD   Please update your documentation within the medical record to reflect your response to this query.                                                                                         04/17/12   Dr. Jomarie Longs and/or Associates,  In a better effort to capture your patient's severity of illness, reflect appropriate length of stay and utilization of resources, a review of the patient medical record has revealed the following indicators:  Registered Dietician Assessment by Clarene Duke dated 04/16/12 in [facility] Reason for Assessment: MST (Malnutrition Screening Tool)  INTERVENTION:  1. Continue Ensure Complete as ordered.  2. RD will continue to follow  DOCUMENTATION CODES  Per approved criteria   -Severe malnutrition in the context of acute illness or injury   Pt was just d/c'd on 11/17. Pt is followed by hospice. Admitted with SOB and chest pain. Per notes, family requesting pt be full code through the holidays.  Pt with a hx of weight loss. Current weight is down another 5 lbs since d/c. (3.4% body weight in 1 week, severe weight loss).  At previous admission pt was dx with severe malnutrition in the context of acute illness or injury. This continues and likely r/t chronic illness.    Based on your clinical judgment, please document in the progress notes and discharge summary if a condition below provides greater specificity regarding the patient's nutritional status:   - Severe malnutrition in the context of acute illness or injury    - Other Condition   - Unable to Clinically Determine    In responding to this query please exercise your independent judgment.    The fact that a query is asked, does not imply that any particular answer is desired or expected.    Reviewed: changes made  Thank You,  Jerral Ralph  RN BSN CCDS Certified Clinical Documentation  Specialist: Cell   [phone]   TO RESPOND TO THE THIS QUERY, FOLLOW THE INSTRUCTIONS BELOW:  1. If needed, update documentation for the patient's encounter via the notes activity.  2. Access this query again and click edit on the In [employer].  3. After updating, or not, click F2 to complete all highlighted (required) fields concerning your review. Select "additional documentation in the medical record" OR "no additional documentation provided".  4. Click Sign note button.  5. The deficiency will fall out of your In Basket *Please let us know if you are not able to complete this workflow by phone or e-mail (listed below).   Health Information Management [place]  I added an addendum to my last progress note from 11/26 Zannie Cove, MD

## 2012-04-18 LAB — BASIC METABOLIC PANEL
BUN: 27 mg/dL — ABNORMAL HIGH (ref 6–23)
Creatinine, Ser: 1.39 mg/dL — ABNORMAL HIGH (ref 0.50–1.10)
GFR calc Af Amer: 37 mL/min — ABNORMAL LOW (ref >90)
GFR calc non Af Amer: 32 mL/min — ABNORMAL LOW (ref >90)
Potassium: 4.1 mEq/L (ref 3.5–5.1)

## 2012-04-18 MED ORDER — CIPROFLOXACIN HCL 500 MG PO TABS
500.0000 mg | ORAL_TABLET | ORAL | Status: DC
  Administered 2012-04-18 – 2012-04-19 (×2): 500 mg via ORAL
  Filled 2012-04-18 (×3): qty 1

## 2012-04-18 NOTE — Progress Notes (Addendum)
Room 4739 - [name] -[facility] RN Visit-R.Jurni Cesaro RN  Related admission to [facility] diagnosis of aortic valve disorder.   Pt is FULL code-confirmed by dtr [name] this am at visit.    Pt alert, confused, lying in bed, without complaints of pain or discomfort.    Dtr [name] present- who discusses outside the room that she is unable to "do this at home" ie. Care for pt in the home.  Dtr states pt calls out her heart is hurting and dtr calls 911 - she acknowledges [facility] RN has asked her to call [facility] but dtr calls 911 instead.  She further states she would like pt to go to [facility].  Discussed the expected prognosis for BP is 4-6 weeks.  Dtr states family will be in on Friday and planning on meeting with [facility] attending to discuss patient's disposition.   Advised [facility] RN / SW of this discussion  Follow up:  Pt goes to adult day care daily - has become too weak - also has CAPS workers at the home.  Discussed with Dr. Arbutus Leas.   Patient's home medication list is on shadow chart.   Please call [facility] @ [phone]- ask for RN Liaison or after hours,ask for on-call RN with any hospice needs.    Thank you.  Joneen Boers, RN  [facility]  Hospice Liaison

## 2012-04-18 NOTE — Progress Notes (Addendum)
[facility] PROGRESS NOTE  [name] EAV:[id number] DOB: 1918-03-06 DOA: 04/14/2012 PCP: Laurena Slimmer, MD  Assessment/Plan: Acute on chronic diastolic CHF -neg kg for the admission, I/Os inaccurate -Ejection fraction 60-65% -Likely due to poor insight on medications on the family's part -Continue by mouth furosemide -Daily weights/strict I/Os -Family meeting scheduled for 3 PM 04/19/2012 Critical aortic stenosis -Status post valvuloplasty June 2013 with small amount of improvement -Renal failure has precluded use of ACE inhibitors Anemia of chronic kidney disease -Hemoglobin has remained stable -Check CBC in the morning CKD stage III -Creatinine has returned to baseline with decrease of furosemide Rheumatoid arthritis -Continue Arava Dementia -Continue Aricept UTI -start cipro for UTI pending cultures -lost IV access--cannot give CTX    Family Communication:   Long discussion with daughter Hadli Vandemark Disposition Plan:   Home when medically stable--spoke with [facility] today who informed me that patient had been getting PCS services thru Medicaid  --Total time with patient and family 45 minutes      Procedures/Studies: Dg Chest 2 View  04/14/2012  *RADIOLOGY REPORT*  Clinical Data: Chest pain and shortness of breath.  CHEST - 2 VIEW  Comparison: Plain film of the chest 04/06/2012 and CT chest 03/22/2011.  Findings: There is marked cardiomegaly.  Pulmonary vascular congestion without frank edema is noted.  No focal airspace disease or pleural effusion.  No pneumothorax.  IVC filter is noted.  IMPRESSION: Marked cardiomegaly without edema.   Original Report Authenticated By: Holley Dexter, M.D.    Dg Chest Portable 1 View  04/06/2012  *RADIOLOGY REPORT*  Clinical Data: Weakness.  Coronary artery disease.  Personal history pulmonary embolism.  PORTABLE CHEST - 1 VIEW  Comparison: 11/16/2011 the  Findings: Cardiomegaly and pulmonary vascular congestion appears  stable.  No evidence of acute infiltrate or edema.  No evidence of pleural effusion.  Ectasia and atherosclerotic calcification of the thoracic aorta appears stable.  IMPRESSION: Stable cardiomegaly and pulmonary vascular congestion.  No acute findings.   Original Report Authenticated By: Myles Rosenthal, M.D.          Subjective: Patient is pleasantly confused. She denies any fevers, chills, chest pain, shortness of breath, abdominal pain, vomiting. There is been no report of diarrhea.  Objective: Filed Vitals:   04/17/12 2101 04/18/12 0556 04/18/12 0909 04/18/12 1414  BP: 115/49 140/46 132/50 124/44  Pulse: 81 74 67 69  Temp: 97.7 F (36.5 C) 97.6 F (36.4 C)  98.2 F (36.8 C)  TempSrc: Oral Oral  Oral  Resp: 20 20  20   Height:      Weight:  62 kg (136 lb 11 oz)    SpO2: 100% 100%  100%    Intake/Output Summary (Last 24 hours) at 04/18/12 1826 Last data filed at 04/18/12 1323  Gross per 24 hour  Intake    453 ml  Output      0 ml  Net    453 ml   Weight change: 0.2 kg (7.1 oz) Exam:   General:  Pt is alert, follows commands appropriately, not in acute distress  HEENT: No icterus, No thrush,[place]/AT  Cardiovascular: RRR, S1/S2, no rubs, no gallops  Respiratory: Fine bibasilar crackles, left greater than right. No wheezes or rhonchi. Good air movement.  Abdomen: Soft/+BS, non tender, non distended, no guarding  Extremities: trace edema, No lymphangitis, No petechiae, No rashes, no synovitis  Data Reviewed: Basic Metabolic Panel:  Lab 04/18/12 7829 04/17/12 1552 04/16/12 0614 04/15/12 0644 04/14/12 1840 04/14/12 1232  NA 142  142 143 140 -- 138  K 4.1 4.8 3.8 4.5 -- 4.7  CL 101 100 102 99 -- 101  CO2 32 34* 31 28 -- 26  GLUCOSE 101* 122* 99 93 -- 112*  BUN 27* 28* 28* 29* -- 28*  CREATININE 1.39* 1.62* 1.59* 1.46* 1.44* --  CALCIUM 9.3 9.5 9.6 9.6 -- 9.7  MG -- 2.4 -- -- -- --  PHOS -- -- -- -- -- --   Liver Function Tests:  Lab 04/14/12 1232  AST 17  ALT 14   ALKPHOS 95  BILITOT 0.3  PROT 6.7  ALBUMIN 3.2*   No results found for this basename: LIPASE:5,AMYLASE:5 in the last 168 hours No results found for this basename: AMMONIA:5 in the last 168 hours CBC:  Lab 04/15/12 0644 04/14/12 1840 04/14/12 1232  WBC 5.6 6.9 7.6  NEUTROABS -- -- 6.2  HGB 10.5* 10.9* 10.4*  HCT 33.1* 34.3* 33.1*  MCV 83.2 84.3 84.0  PLT 133* 226 217   Cardiac Enzymes:  Lab 04/14/12 1233  CKTOTAL --  CKMB --  CKMBINDEX --  TROPONINI <0.30   BNP: No components found with this basename: POCBNP:5 CBG: No results found for this basename: GLUCAP:5 in the last 168 hours  Recent Results (from the past 240 hour(s))  URINE CULTURE     Status: Normal (Preliminary result)   Collection Time   04/16/12  4:18 PM      Component Value Range Status Comment   Specimen Description URINE, CLEAN CATCH   Final    Special Requests NONE   Final    Culture  Setup Time 04/16/2012 16:54   Final    Colony Count >=100,000 COLONIES/ML   Final    Culture GRAM NEGATIVE RODS   Final    Report Status PENDING   Incomplete      Scheduled Meds:   . aspirin  81 mg Oral Daily  . atorvastatin  20 mg Oral QHS  . cefTRIAXone (ROCEPHIN)  IV  1 g Intravenous Q24H  . ciprofloxacin  500 mg Oral Q24H  . donepezil  10 mg Oral QHS  . enoxaparin (LOVENOX) injection  30 mg Subcutaneous Q24H  . famotidine  20 mg Oral QHS  . feeding supplement  237 mL Oral TID BM  . furosemide  40 mg Oral Daily  . labetalol  200 mg Oral BID  . leflunomide  20 mg Oral Daily  . levothyroxine  125 mcg Oral QAC breakfast  . montelukast  10 mg Oral QHS  . potassium chloride  20 mEq Oral Daily  . predniSONE  7.5 mg Oral Daily  . sodium chloride  3 mL Intravenous Q12H  . vitamin C  500 mg Oral Daily   Continuous Infusions:    Dellamae Rosamilia, DO  [facility] Pager [phone]  If 7PM-7AM, please contact night-coverage [email or web address] Password [email or web address] 04/18/2012, 6:26 PM   LOS: 4 days

## 2012-04-18 NOTE — Progress Notes (Signed)
Utilization Review Completed.   Wynell Halberg, RN, BSN Nurse Case Manager  [phone]  

## 2012-04-18 NOTE — Progress Notes (Signed)
Room 4739 - [name] -[facility] RN Visit-R.Dayden Viverette RN  Related admission to [facility] diagnosis of aortic valve disorder.   Pt isFULL  code.    Pt alert confused, does not remember why she is in the hospital or that she is in the hospital-sitting up in bed, without complaints of pain or discomfort.    No family present.   Patient's home medication list is on shadow chart.   Please call [facility] @ [phone]- ask for RN Liaison or after hours,ask for on-call RN with any hospice needs.    Thank you.  Joneen Boers, RN  [facility]  Hospice Liaison

## 2012-04-18 NOTE — Progress Notes (Signed)
Pt had an episodes of 10 beats of VTach, asymptomatic, BP 130/46, HR 75, RR 18, SpO2 100%. Maren Reamer notified. Will continue to monitor pt.

## 2012-04-19 DIAGNOSIS — I5033 Acute on chronic diastolic (congestive) heart failure: Principal | ICD-10-CM

## 2012-04-19 LAB — BASIC METABOLIC PANEL
Calcium: 9.4 mg/dL (ref 8.4–10.5)
Chloride: 104 mEq/L (ref 96–112)
Creatinine, Ser: 1.45 mg/dL — ABNORMAL HIGH (ref 0.50–1.10)
GFR calc Af Amer: 35 mL/min — ABNORMAL LOW (ref >90)

## 2012-04-19 LAB — CBC
Platelets: 198 10*3/uL (ref 150–400)
RDW: 15.4 % (ref 11.5–15.5)
WBC: 5.6 10*3/uL (ref 4.0–10.5)

## 2012-04-19 MED ORDER — CIPROFLOXACIN HCL 500 MG PO TABS: 500.0000 mg | ORAL_TABLET | ORAL | Status: DC

## 2012-04-19 MED ORDER — MEGESTROL ACETATE 40 MG/ML PO SUSP: 400.0000 mg | Freq: Every day | ORAL | Status: AC

## 2012-04-19 MED ORDER — MEGESTROL ACETATE 40 MG/ML PO SUSP: 400.0000 mg | Freq: Every day | ORAL | Status: DC

## 2012-04-19 NOTE — Progress Notes (Signed)
DC IV, DC Tele, DC to Home. Discharge instructions and home medications discussed with patient and patient's family. Patient and family denied any questions or concerns at this time. Patient leaving unit via EMS and appears in no acute distress.

## 2012-04-19 NOTE — Discharge Summary (Addendum)
Physician Discharge Summary  [name] ZOX:[id number] DOB: [birth year] DOA: 04/14/2012  PCP: Laurena Slimmer, MD  Admit date: 04/14/2012 Discharge date: 04/19/2012  Recommendations for Outpatient Follow-up:  1. Pt will need to follow up with PCP in 2 weeks post discharge  Discharge Diagnoses:  Active Problems:  DEMENTIA  AORTIC STENOSIS  Anemia of chronic disease  Diastolic CHF, acute on chronic  Chronic kidney disease, stage 3  Aortic stenosis, severe Acute on chronic diastolic CHF  -neg 4 kg for the admission, I/Os inaccurate  -Ejection fraction 60-65%  -Likely due to poor insight on medications on the family's part  -Continue by mouth furosemide  -weigh patient daily-call MD if >3 pound weight gain in 48hrs -had family meeting with sons and daughters from out of town today Critical aortic stenosis  -Status post valvuloplasty June 2013 with small amount of improvement  -Renal failure has precluded use of ACE inhibitors  -Not a candidate for any further vascular intervention, as per her primary cardiologist note 03/02/2012  Anemia of chronic kidney disease  -Hemoglobin has remained stable  CKD stage III  -Creatinine has returned to near baseline with decrease of furosemide  Rheumatoid arthritis  -Continue Arava  Dementia  -Continue Aricept  UTI  -start cipro for UTI pending cultures  -lost IV access--cannot give CTX  HTN  -continue labetalol Protein Calorie Malnutrition, mild -albumin 3.1   Discharge Condition: Stable  Disposition:  discharge home  Diet: Cardiac Wt Readings from Last 3 Encounters:  04/19/12 61 kg (134 lb 7.7 oz)  04/08/12 65 kg (143 lb 4.8 oz)  03/02/12 68.947 kg (152 lb)    History of present illness:  [name] with diastolic CHF, critical aortic stenosis with h/o palliative balloon aortic valvuloplasty 6/13, dementia was just discharged from [facility] 11/17 after admission for CHF exacerbation secondary to her valvular disease. She is followed by  hospice for her heart disease and is brought to the ER  with chest pain and shortness of breath  She lives at home with her daughter who reports sudden onset of chest pain, shortness of breath and diaphoresis this morning and hence brought her to the ER. She has also noticed lower extremity edema which developed since her discharge. Upon evaluation in the ER she was noted to have s/s of CHf and slight increase in BNP since discharge.   Hospital Course:  The patient was noted to have a pro BNP of 18,791 at the time of admission. The patient was started on intravenous Lasix 40 mg IV twice a day. The patient gradually improved with improvement of her shortness of breath. With diuresis, the patient's renal function did worsen and peaked at a serum creatinine of 1.62. Her furosemide was decreased back to her home dose of 40 mg daily gradually. Her renal function gradually improved with decreasing her furosemide dosing. On the day of discharge, her serum creatinine was 1.45. Her baseline creatinine is 1.1-1.3. The patient's shortness of breath continued to improve. Although her I.'s and O.'s were not completely accurate, the patient did lose 4 kg during her admission. Her hemoglobin remained stable. Her urine culture was obtained on 04/16/2012 and showed gram-negative rods. The patient was initially placed on ceftriaxone, but after she lost IV access, the patient was switched to ciprofloxacin 500 mg daily. The patient will be discharged home with 5 additional days of ciprofloxacin. On the day of discharge, a family meeting was held with the patient primary caregiver, [name] as well as 5 other sons and  daughters. After a full update and answering all her questions, the family has agreed to make the patient a DO NOT RESUSCITATE. The social worker from hospice and palliative care  Of [name] also met with the patient's family, and they decided to continue with hospice care at home. The family also requested  that the patient receive an appetite stimulant. The possible side effects including but not limited to confusion, lethargy, hypertension, and headache were discussed with the family and they agree to allow the patient to take the medication and follow treatment protocol.    Discharge Exam: Filed Vitals:   04/19/12 1413  BP: 151/72  Pulse: 72  Temp: 98 F (36.7 C)  Resp: 20   Filed Vitals:   04/18/12 2100 04/19/12 0600 04/19/12 1059 04/19/12 1413  BP: 142/51 158/52 161/49 151/72  Pulse: 69 73 70 72  Temp: 98.1 F (36.7 C) 98.4 F (36.9 C)  98 F (36.7 C)  TempSrc: Oral Oral  Oral  Resp: 20 20  20   Height:      Weight:  61 kg (134 lb 7.7 oz)    SpO2: 100% 100%  96%   General: A&O x 2, NAD, pleasant, cooperative Cardiovascular: RRR, no rub, no gallop, no S3 Respiratory: Bibasilar crackles. No wheezes rhonchi. Good air movement. Abdomen:soft, nontender, nondistended, positive bowel sounds Extremities: 1+ edema, No lymphangitis, no petechiae  Discharge Instructions  Discharge Orders    Future Orders Please Complete By Expires   Diet - low sodium heart healthy      Increase activity slowly          Medication List     As of 04/19/2012  3:54 PM    TAKE these medications         acetaminophen 500 MG chewable tablet   Commonly known as: TYLENOL   Chew 500-1,000 mg by mouth every 6 (six) hours as needed. For pain      aspirin 81 MG tablet   Take 81 mg by mouth daily.      atorvastatin 20 MG tablet   Commonly known as: LIPITOR   Take 20 mg by mouth at bedtime.      bacitracin ophthalmic ointment   Place 1 application into both eyes 2 (two) times daily. apply to eye      ciprofloxacin 500 MG tablet   Commonly known as: CIPRO   Take 1 tablet (500 mg total) by mouth daily.      donepezil 10 MG tablet   Commonly known as: ARICEPT   Take 10 mg by mouth at bedtime.      feeding supplement Liqd   Take 237 mLs by mouth 3 (three) times daily between meals.       furosemide 40 MG tablet   Commonly known as: LASIX   Take 1 tablet (40 mg total) by mouth daily.      labetalol 200 MG tablet   Commonly known as: NORMODYNE   Take 1 tablet (200 mg total) by mouth 2 (two) times daily.      leflunomide 20 MG tablet   Commonly known as: ARAVA   Take 20 mg by mouth daily.      levothyroxine 125 MCG tablet   Commonly known as: SYNTHROID, LEVOTHROID   Take 125 mcg by mouth daily.      loperamide 2 MG capsule   Commonly known as: IMODIUM   Take 2 mg by mouth as needed. For diarrhea      megestrol 40 MG/ML  suspension   Commonly known as: MEGACE   Take 10 mLs (400 mg total) by mouth daily.      montelukast 10 MG tablet   Commonly known as: SINGULAIR   Take 10 mg by mouth at bedtime.      OVER THE COUNTER MEDICATION   Take 1 tablet by mouth daily. Life Guard antioxidant therapy      potassium chloride 20 MEQ/15ML (10%) solution   Take 10 mEq by mouth daily.      predniSONE 5 MG tablet   Commonly known as: DELTASONE   Take 7.5 mg by mouth daily.      ranitidine 150 MG tablet   Commonly known as: ZANTAC   Take 150 mg by mouth 2 (two) times daily.      traMADol 50 MG tablet   Commonly known as: ULTRAM   Take 50 mg by mouth at bedtime as needed. For pain      VITAMIN B1-B12 IM   Inject 1,000 mg into the muscle every 30 (thirty) days.      vitamin C 500 MG tablet   Commonly known as: ASCORBIC ACID   Take 500 mg by mouth daily.         The results of significant diagnostics from this hospitalization (including imaging, microbiology, ancillary and laboratory) are listed below for reference.    Significant Diagnostic Studies: Dg Chest 2 View  04/14/2012  *RADIOLOGY REPORT*  Clinical Data: Chest pain and shortness of breath.  CHEST - 2 VIEW  Comparison: Plain film of the chest 04/06/2012 and CT chest 03/22/2011.  Findings: There is marked cardiomegaly.  Pulmonary vascular congestion without frank edema is noted.  No focal airspace disease  or pleural effusion.  No pneumothorax.  IVC filter is noted.  IMPRESSION: Marked cardiomegaly without edema.   Original Report Authenticated By: Holley Dexter, M.D.    Dg Chest Portable 1 View  04/06/2012  *RADIOLOGY REPORT*  Clinical Data: Weakness.  Coronary artery disease.  Personal history pulmonary embolism.  PORTABLE CHEST - 1 VIEW  Comparison: 11/16/2011 the  Findings: Cardiomegaly and pulmonary vascular congestion appears stable.  No evidence of acute infiltrate or edema.  No evidence of pleural effusion.  Ectasia and atherosclerotic calcification of the thoracic aorta appears stable.  IMPRESSION: Stable cardiomegaly and pulmonary vascular congestion.  No acute findings.   Original Report Authenticated By: Myles Rosenthal, M.D.      Microbiology: Recent Results (from the past 240 hour(s))  URINE CULTURE     Status: Normal (Preliminary result)   Collection Time   04/16/12  4:18 PM      Component Value Range Status Comment   Specimen Description URINE, CLEAN CATCH   Final    Special Requests NONE   Final    Culture  Setup Time 04/16/2012 16:54   Final    Colony Count >=100,000 COLONIES/ML   Final    Culture GRAM NEGATIVE RODS   Final    Report Status PENDING   Incomplete      Labs: Basic Metabolic Panel:  Lab 04/19/12 4540 04/18/12 0945 04/17/12 1552 04/16/12 0614 04/15/12 0644  NA 145 142 142 143 140  K 4.0 4.1 -- -- --  CL 104 101 100 102 99  CO2 33* 32 34* 31 28  GLUCOSE 80 101* 122* 99 93  BUN 28* 27* 28* 28* 29*  CREATININE 1.45* 1.39* 1.62* 1.59* 1.46*  CALCIUM 9.4 9.3 9.5 9.6 9.6  MG -- -- 2.4 -- --  PHOS -- -- -- -- --  Liver Function Tests:  Lab 04/14/12 1232  AST 17  ALT 14  ALKPHOS 95  BILITOT 0.3  PROT 6.7  ALBUMIN 3.2*   No results found for this basename: LIPASE:5,AMYLASE:5 in the last 168 hours No results found for this basename: AMMONIA:5 in the last 168 hours CBC:  Lab 04/19/12 0440 04/15/12 0644 04/14/12 1840 04/14/12 1232  WBC 5.6 5.6 6.9  7.6  NEUTROABS -- -- -- 6.2  HGB 9.5* 10.5* 10.9* 10.4*  HCT 31.3* 33.1* 34.3* 33.1*  MCV 84.8 83.2 84.3 84.0  PLT 198 133* 226 217   Cardiac Enzymes:  Lab 04/14/12 1233  CKTOTAL --  CKMB --  CKMBINDEX --  TROPONINI <0.30   BNP: No components found with this basename: POCBNP:5 CBG: No results found for this basename: GLUCAP:5 in the last 168 hours  Time coordinating discharge:  Greater than 30 minutes  Signed:  Dimitrious Micciche, DO [facility] Pager: [phone] 04/19/2012, 3:54 PM

## 2012-04-19 NOTE — Progress Notes (Signed)
[facility] on Call MSW note: MSW attended family meeting with family and Dr. Onalee Hua Tat. Pt is scheduled to go home today. Dr. Arbutus Leas will write order for magace and cipro. Pt will need [facility] transport back home. Family had concerns about hospice care. MSW addressed these concerns and engaged family in problem solving. MSW educated daughter on importance of calling hospice first. MSW discovered that daughter had wrong hospice number. MSW gave daughter correct on call hospice number. Daughter reported that pt will no longer be able to go to day care. Pt does have CAP worker during the week. Daughter is currently working out the schedule and hours for CAP worker. Hospice services will continue at home. Pt will go home with DNR. MSW discussed case with Dr. Arbutus Leas. Please call with concerns.   [name], MSW

## 2012-04-20 ENCOUNTER — Telehealth: Payer: Self-pay | Admitting: Cardiovascular Disease

## 2012-04-20 LAB — URINE CULTURE

## 2012-04-20 NOTE — Telephone Encounter (Signed)
New Problem:    Called in wanting to clarify the patient's potassium chloride 20 MEQ/15ML (10%) solution.  Also would like to let dr. Excell Seltzer know that she has ordered the patient's compozine.  Please call back.

## 2012-04-20 NOTE — Telephone Encounter (Signed)
Confirmed dosage below on potassium.

## 2012-04-25 ENCOUNTER — Other Ambulatory Visit: Payer: Self-pay | Admitting: Internal Medicine

## 2012-05-04 NOTE — Telephone Encounter (Signed)
Per Dr Excell Seltzer DNR order is appropriate.  I spoke with Toniann Fail and made her aware of order.  She will have Hospice MD sign order for DNR.

## 2012-05-04 NOTE — Telephone Encounter (Signed)
Nf/u   Family is requesting a DNR to be sign.  Need an order from Dr. Excell Seltzer. If so, can the hospice MD sign the DNR form.

## 2012-05-21 ENCOUNTER — Telehealth: Payer: Self-pay | Admitting: Cardiovascular Disease

## 2012-05-21 NOTE — Telephone Encounter (Signed)
Discussed with dr cooper, okay given by dr cooper for liquid morphine. The number for sherry is not a working number, hospice to text sherry and have her call back.

## 2012-05-21 NOTE — Telephone Encounter (Signed)
They need an order for liquid morphine for the above mentioned pt/lg

## 2012-05-21 NOTE — Telephone Encounter (Signed)
Spoke with sherry, okay given for the liquid morphine, they will have the hospice doc write the order.

## 2012-05-28 ENCOUNTER — Encounter (HOSPITAL_COMMUNITY): Payer: Self-pay | Admitting: *Deleted

## 2012-05-28 ENCOUNTER — Inpatient Hospital Stay (HOSPITAL_COMMUNITY)
Admission: EM | Admit: 2012-05-28 | Discharge: 2012-05-31 | DRG: 388 | Disposition: A | Discharge status: Skilled Nursing Facility | Attending: Family Medicine | Admitting: Family Medicine

## 2012-05-28 DIAGNOSIS — I129 Hypertensive chronic kidney disease with stage 1 through stage 4 chronic kidney disease, or unspecified chronic kidney disease: Secondary | ICD-10-CM | POA: Diagnosis present

## 2012-05-28 DIAGNOSIS — Z66 Do not resuscitate: Secondary | ICD-10-CM

## 2012-05-28 DIAGNOSIS — Z7982 Long term (current) use of aspirin: Secondary | ICD-10-CM

## 2012-05-28 DIAGNOSIS — D126 Benign neoplasm of colon, unspecified: Secondary | ICD-10-CM

## 2012-05-28 DIAGNOSIS — Z86711 Personal history of pulmonary embolism: Secondary | ICD-10-CM

## 2012-05-28 DIAGNOSIS — I2699 Other pulmonary embolism without acute cor pulmonale: Secondary | ICD-10-CM

## 2012-05-28 DIAGNOSIS — R55 Syncope and collapse: Secondary | ICD-10-CM

## 2012-05-28 DIAGNOSIS — R5381 Other malaise: Secondary | ICD-10-CM | POA: Diagnosis present

## 2012-05-28 DIAGNOSIS — Z860100 Personal history of colon polyps, unspecified: Secondary | ICD-10-CM

## 2012-05-28 DIAGNOSIS — I359 Nonrheumatic aortic valve disorder, unspecified: Secondary | ICD-10-CM

## 2012-05-28 DIAGNOSIS — N183 Chronic kidney disease, stage 3 unspecified: Secondary | ICD-10-CM

## 2012-05-28 DIAGNOSIS — I5032 Chronic diastolic (congestive) heart failure: Secondary | ICD-10-CM

## 2012-05-28 DIAGNOSIS — D500 Iron deficiency anemia secondary to blood loss (chronic): Secondary | ICD-10-CM

## 2012-05-28 DIAGNOSIS — R627 Adult failure to thrive: Secondary | ICD-10-CM | POA: Diagnosis present

## 2012-05-28 DIAGNOSIS — E785 Hyperlipidemia, unspecified: Secondary | ICD-10-CM | POA: Diagnosis present

## 2012-05-28 DIAGNOSIS — K5641 Fecal impaction: Principal | ICD-10-CM | POA: Diagnosis present

## 2012-05-28 DIAGNOSIS — F0390 Unspecified dementia without behavioral disturbance: Secondary | ICD-10-CM | POA: Diagnosis present

## 2012-05-28 DIAGNOSIS — IMO0002 Reserved for concepts with insufficient information to code with codable children: Secondary | ICD-10-CM

## 2012-05-28 DIAGNOSIS — R400 Somnolence: Secondary | ICD-10-CM

## 2012-05-28 DIAGNOSIS — R1319 Other dysphagia: Secondary | ICD-10-CM

## 2012-05-28 DIAGNOSIS — Z950 Presence of cardiac pacemaker: Secondary | ICD-10-CM

## 2012-05-28 DIAGNOSIS — F068 Other specified mental disorders due to known physiological condition: Secondary | ICD-10-CM

## 2012-05-28 DIAGNOSIS — K5900 Constipation, unspecified: Secondary | ICD-10-CM

## 2012-05-28 DIAGNOSIS — Z96659 Presence of unspecified artificial knee joint: Secondary | ICD-10-CM

## 2012-05-28 DIAGNOSIS — Z888 Allergy status to other drugs, medicaments and biological substances status: Secondary | ICD-10-CM

## 2012-05-28 DIAGNOSIS — N390 Urinary tract infection, site not specified: Secondary | ICD-10-CM

## 2012-05-28 DIAGNOSIS — M069 Rheumatoid arthritis, unspecified: Secondary | ICD-10-CM

## 2012-05-28 DIAGNOSIS — I5033 Acute on chronic diastolic (congestive) heart failure: Secondary | ICD-10-CM

## 2012-05-28 DIAGNOSIS — D638 Anemia in other chronic diseases classified elsewhere: Secondary | ICD-10-CM

## 2012-05-28 DIAGNOSIS — E860 Dehydration: Secondary | ICD-10-CM | POA: Diagnosis present

## 2012-05-28 DIAGNOSIS — R531 Weakness: Secondary | ICD-10-CM

## 2012-05-28 DIAGNOSIS — I2510 Atherosclerotic heart disease of native coronary artery without angina pectoris: Secondary | ICD-10-CM | POA: Diagnosis present

## 2012-05-28 DIAGNOSIS — Z79899 Other long term (current) drug therapy: Secondary | ICD-10-CM

## 2012-05-28 DIAGNOSIS — I350 Nonrheumatic aortic (valve) stenosis: Secondary | ICD-10-CM

## 2012-05-28 DIAGNOSIS — E039 Hypothyroidism, unspecified: Secondary | ICD-10-CM | POA: Diagnosis present

## 2012-05-28 DIAGNOSIS — R197 Diarrhea, unspecified: Secondary | ICD-10-CM

## 2012-05-28 DIAGNOSIS — I214 Non-ST elevation (NSTEMI) myocardial infarction: Secondary | ICD-10-CM

## 2012-05-28 DIAGNOSIS — Z8719 Personal history of other diseases of the digestive system: Secondary | ICD-10-CM

## 2012-05-28 DIAGNOSIS — I509 Heart failure, unspecified: Secondary | ICD-10-CM

## 2012-05-28 DIAGNOSIS — K2211 Ulcer of esophagus with bleeding: Secondary | ICD-10-CM

## 2012-05-28 DIAGNOSIS — I10 Essential (primary) hypertension: Secondary | ICD-10-CM

## 2012-05-28 DIAGNOSIS — R42 Dizziness and giddiness: Secondary | ICD-10-CM

## 2012-05-28 HISTORY — DX: Non-ST elevation (NSTEMI) myocardial infarction: I21.4

## 2012-05-28 LAB — COMPREHENSIVE METABOLIC PANEL
Albumin: 2.9 g/dL — ABNORMAL LOW (ref 3.5–5.2)
Alkaline Phosphatase: 65 U/L (ref 39–117)
BUN: 50 mg/dL — ABNORMAL HIGH (ref 6–23)
Chloride: 103 mEq/L (ref 96–112)
Glucose, Bld: 96 mg/dL (ref 70–99)
Potassium: 4.5 mEq/L (ref 3.5–5.1)
Total Bilirubin: 0.7 mg/dL (ref 0.3–1.2)

## 2012-05-28 LAB — LIPASE, BLOOD: Lipase: 11 U/L (ref 11–59)

## 2012-05-28 LAB — CBC WITH DIFFERENTIAL/PLATELET
Basophils Relative: 0 % (ref 0–1)
Hemoglobin: 11.4 g/dL — ABNORMAL LOW (ref 12.0–15.0)
Lymphs Abs: 0.5 10*3/uL — ABNORMAL LOW (ref 0.7–4.0)
Monocytes Relative: 4 % (ref 3–12)
Neutro Abs: 8.9 10*3/uL — ABNORMAL HIGH (ref 1.7–7.7)
Neutrophils Relative %: 91 % — ABNORMAL HIGH (ref 43–77)
RBC: 4.21 MIL/uL (ref 3.87–5.11)

## 2012-05-28 MED ORDER — ONDANSETRON 8 MG PO TBDP
8.0000 mg | ORAL_TABLET | Freq: Once | ORAL | Status: AC
  Administered 2012-05-28: 8 mg via ORAL
  Filled 2012-05-28: qty 1

## 2012-05-28 NOTE — ED Notes (Signed)
Pt denies nausea; no vomiting.

## 2012-05-28 NOTE — ED Notes (Signed)
ZOX:[id number]<AV> Expected date:<BR> Expected time:<BR> Means of arrival:<BR> Comments:<BR> EMS/79 yo with nausea/vomiting and abdominal pain

## 2012-05-28 NOTE — ED Notes (Signed)
Pt to room via EMS for complaints of Nausea, vomiting; abd pain; no vomiting today; Hospice sent to ED for evaluation.

## 2012-05-29 ENCOUNTER — Encounter (HOSPITAL_COMMUNITY): Payer: Self-pay | Admitting: Internal Medicine

## 2012-05-29 ENCOUNTER — Inpatient Hospital Stay (HOSPITAL_COMMUNITY)

## 2012-05-29 ENCOUNTER — Emergency Department (HOSPITAL_COMMUNITY)

## 2012-05-29 DIAGNOSIS — I359 Nonrheumatic aortic valve disorder, unspecified: Secondary | ICD-10-CM

## 2012-05-29 DIAGNOSIS — K5900 Constipation, unspecified: Secondary | ICD-10-CM | POA: Diagnosis present

## 2012-05-29 DIAGNOSIS — Z66 Do not resuscitate: Secondary | ICD-10-CM | POA: Diagnosis present

## 2012-05-29 DIAGNOSIS — I214 Non-ST elevation (NSTEMI) myocardial infarction: Secondary | ICD-10-CM

## 2012-05-29 DIAGNOSIS — R6889 Other general symptoms and signs: Secondary | ICD-10-CM

## 2012-05-29 DIAGNOSIS — N390 Urinary tract infection, site not specified: Secondary | ICD-10-CM | POA: Diagnosis present

## 2012-05-29 HISTORY — DX: Non-ST elevation (NSTEMI) myocardial infarction: I21.4

## 2012-05-29 LAB — URINALYSIS, ROUTINE W REFLEX MICROSCOPIC
Hgb urine dipstick: NEGATIVE
Ketones, ur: NEGATIVE mg/dL
Nitrite: NEGATIVE
pH: 5.5 (ref 5.0–8.0)

## 2012-05-29 LAB — CREATININE, SERUM: Creatinine, Ser: 1.99 mg/dL — ABNORMAL HIGH (ref 0.50–1.10)

## 2012-05-29 LAB — POCT I-STAT TROPONIN I

## 2012-05-29 LAB — CBC
HCT: 32.1 % — ABNORMAL LOW (ref 36.0–46.0)
Hemoglobin: 10.5 g/dL — ABNORMAL LOW (ref 12.0–15.0)
MCH: 26.6 pg (ref 26.0–34.0)
MCHC: 32.7 g/dL (ref 30.0–36.0)
RBC: 3.94 MIL/uL (ref 3.87–5.11)

## 2012-05-29 LAB — TROPONIN I
Troponin I: 0.31 ng/mL (ref <0.30)
Troponin I: <0.3 ng/mL (ref <0.30)

## 2012-05-29 LAB — TSH: TSH: 0.831 u[IU]/mL (ref 0.350–4.500)

## 2012-05-29 LAB — URINE MICROSCOPIC-ADD ON

## 2012-05-29 MED ORDER — MAGNESIUM CITRATE PO SOLN
0.5000 | Freq: Once | ORAL | Status: AC
  Administered 2012-05-29: 0.5 via ORAL

## 2012-05-29 MED ORDER — ASPIRIN 81 MG PO CHEW
324.0000 mg | CHEWABLE_TABLET | Freq: Once | ORAL | Status: AC
  Administered 2012-05-29: 324 mg via ORAL
  Filled 2012-05-29: qty 4

## 2012-05-29 MED ORDER — ENSURE COMPLETE PO LIQD
237.0000 mL | Freq: Three times a day (TID) | ORAL | Status: DC
  Administered 2012-05-29 – 2012-05-31 (×6): 237 mL via ORAL

## 2012-05-29 MED ORDER — DEXTROSE 5 % IV SOLN
1.0000 g | INTRAVENOUS | Status: DC
  Administered 2012-05-30 – 2012-05-31 (×2): 1 g via INTRAVENOUS
  Filled 2012-05-29 (×3): qty 10

## 2012-05-29 MED ORDER — HEPARIN SODIUM (PORCINE) 5000 UNIT/ML IJ SOLN
5000.0000 [IU] | Freq: Three times a day (TID) | INTRAMUSCULAR | Status: DC
  Administered 2012-05-29 – 2012-05-31 (×7): 5000 [IU] via SUBCUTANEOUS
  Filled 2012-05-29 (×9): qty 1

## 2012-05-29 MED ORDER — ONDANSETRON HCL 4 MG/2ML IJ SOLN
4.0000 mg | Freq: Four times a day (QID) | INTRAMUSCULAR | Status: DC | PRN
  Administered 2012-05-30: 4 mg via INTRAVENOUS
  Filled 2012-05-29: qty 2

## 2012-05-29 MED ORDER — FLEET ENEMA 7-19 GM/118ML RE ENEM
1.0000 | ENEMA | Freq: Once | RECTAL | Status: AC
  Administered 2012-05-29: 1 via RECTAL
  Filled 2012-05-29: qty 1

## 2012-05-29 MED ORDER — DEXTROSE 5 % IV SOLN
1.0000 g | Freq: Once | INTRAVENOUS | Status: AC
  Administered 2012-05-29: 1 g via INTRAVENOUS
  Filled 2012-05-29: qty 10

## 2012-05-29 MED ORDER — HYDROCORTISONE SOD SUCCINATE 100 MG IJ SOLR
50.0000 mg | Freq: Three times a day (TID) | INTRAMUSCULAR | Status: AC
  Administered 2012-05-29 – 2012-05-30 (×3): 50 mg via INTRAVENOUS
  Filled 2012-05-29 (×3): qty 1

## 2012-05-29 MED ORDER — ATORVASTATIN CALCIUM 20 MG PO TABS
20.0000 mg | ORAL_TABLET | Freq: Every day | ORAL | Status: DC
  Administered 2012-05-29 – 2012-05-30 (×2): 20 mg via ORAL
  Filled 2012-05-29 (×3): qty 1

## 2012-05-29 MED ORDER — ASPIRIN EC 81 MG PO TBEC
81.0000 mg | DELAYED_RELEASE_TABLET | Freq: Every day | ORAL | Status: DC
Start: 2012-05-29 — End: 2012-06-01
  Administered 2012-05-30 – 2012-05-31 (×2): 81 mg via ORAL
  Filled 2012-05-29 (×3): qty 1

## 2012-05-29 MED ORDER — ONDANSETRON HCL 4 MG PO TABS: 4.0000 mg | ORAL_TABLET | Freq: Four times a day (QID) | ORAL | Status: DC | PRN

## 2012-05-29 MED ORDER — DOCUSATE SODIUM 100 MG PO CAPS
100.0000 mg | ORAL_CAPSULE | Freq: Two times a day (BID) | ORAL | Status: DC
  Administered 2012-05-29 – 2012-05-31 (×4): 100 mg via ORAL
  Filled 2012-05-29 (×6): qty 1

## 2012-05-29 MED ORDER — LABETALOL HCL 200 MG PO TABS
200.0000 mg | ORAL_TABLET | Freq: Two times a day (BID) | ORAL | Status: DC
  Filled 2012-05-29 (×2): qty 1

## 2012-05-29 MED ORDER — LEVOTHYROXINE SODIUM 100 MCG IV SOLR
50.0000 ug | Freq: Every day | INTRAVENOUS | Status: DC
  Administered 2012-05-29 – 2012-05-31 (×3): 50 ug via INTRAVENOUS
  Filled 2012-05-29 (×3): qty 2.5

## 2012-05-29 MED ORDER — PREDNISONE 20 MG PO TABS
40.0000 mg | ORAL_TABLET | Freq: Every day | ORAL | Status: DC
  Administered 2012-05-29: 40 mg via ORAL
  Filled 2012-05-29 (×2): qty 2

## 2012-05-29 MED ORDER — LEVOTHYROXINE SODIUM 125 MCG PO TABS
125.0000 ug | ORAL_TABLET | Freq: Every day | ORAL | Status: DC
Start: 2012-05-29 — End: 2012-05-29
  Filled 2012-05-29 (×2): qty 1

## 2012-05-29 MED ORDER — DONEPEZIL HCL 10 MG PO TABS
10.0000 mg | ORAL_TABLET | Freq: Every day | ORAL | Status: DC
  Administered 2012-05-29 – 2012-05-30 (×2): 10 mg via ORAL
  Filled 2012-05-29 (×3): qty 1

## 2012-05-29 MED ORDER — MONTELUKAST SODIUM 10 MG PO TABS
10.0000 mg | ORAL_TABLET | Freq: Every day | ORAL | Status: DC
  Administered 2012-05-29 – 2012-05-30 (×2): 10 mg via ORAL
  Filled 2012-05-29 (×3): qty 1

## 2012-05-29 MED ORDER — ACETAMINOPHEN 500 MG PO TABS: 500.0000 mg | ORAL_TABLET | Freq: Four times a day (QID) | ORAL | Status: DC | PRN

## 2012-05-29 MED ORDER — BACITRACIN 500 UNIT/GM OP OINT
TOPICAL_OINTMENT | OPHTHALMIC | Status: DC
  Administered 2012-05-29 – 2012-05-30 (×7): via OPHTHALMIC
  Administered 2012-05-30: 1 via OPHTHALMIC
  Administered 2012-05-30 – 2012-05-31 (×2): via OPHTHALMIC
  Administered 2012-05-31: 1 via OPHTHALMIC
  Administered 2012-05-31: 09:00:00 via OPHTHALMIC
  Filled 2012-05-29: qty 3.5

## 2012-05-29 MED ORDER — PANTOPRAZOLE SODIUM 40 MG PO TBEC
80.0000 mg | DELAYED_RELEASE_TABLET | Freq: Every day | ORAL | Status: DC
  Administered 2012-05-30 – 2012-05-31 (×2): 80 mg via ORAL
  Filled 2012-05-29 (×3): qty 2

## 2012-05-29 MED ORDER — METOPROLOL TARTRATE 1 MG/ML IV SOLN
5.0000 mg | Freq: Four times a day (QID) | INTRAVENOUS | Status: DC
  Administered 2012-05-29 – 2012-05-31 (×8): 5 mg via INTRAVENOUS
  Filled 2012-05-29 (×11): qty 5

## 2012-05-29 MED ORDER — POTASSIUM CHLORIDE 20 MEQ/15ML (10%) PO LIQD
20.0000 meq | Freq: Every day | ORAL | Status: DC
  Administered 2012-05-30 – 2012-05-31 (×2): 20 meq via ORAL
  Filled 2012-05-29 (×3): qty 15

## 2012-05-29 MED ORDER — MORPHINE SULFATE 4 MG/ML IJ SOLN
2.0000 mg | Freq: Once | INTRAMUSCULAR | Status: AC
  Administered 2012-05-29: 2 mg via INTRAVENOUS
  Filled 2012-05-29: qty 1

## 2012-05-29 MED ORDER — KCL IN DEXTROSE-NACL 20-5-0.9 MEQ/L-%-% IV SOLN
INTRAVENOUS | Status: DC
  Administered 2012-05-29: 50 mL/h via INTRAVENOUS
  Administered 2012-05-30 (×2): via INTRAVENOUS
  Filled 2012-05-29 (×4): qty 1000

## 2012-05-29 NOTE — H&P (Signed)
[facility] History and Physical  [name] WUJ:[id number] DOB: 1918/03/23    PCP:   Laurena Slimmer, MD   Chief Complaint: abdominal pain, nausea, and vomiting.  HPI: [name] is an 79 y.o. female with hx of dementia, RA on ARAVA and prednisone, Hx of PE with IVC filter, aortic stenosis with known valve of .62cm2, DNR code status and NH resident, brought in by caretaker as she have been constipated and having abdominal pain and nausea along with vomiting.  She has been feeling weak and unable to get out of bed.  Reportedly she has been with hospice at home.  She has dementia and is lethargic and really didn't give much history.  Evaluation in the ER showed a normal lipase, normal WBC and Hb, normal electrolytes, with Cr 1.95. An abdominal CT showed fecal impaction, but otherwise no acute process.  Her EKG showed T wave inversions over the precordial leads and her troponin POC was 0.22.  Her UA also showed evidence of UTI.  Hospitalist was asked to admit her for obstipation, UTI, dehydration and possible Non-Stemi.  Rewiew of Systems: Unable to obtain reliable ROS.    Past Medical History  Diagnosis Date  . Chronic diastolic heart failure   . Rheumatoid arthritis   . Ulcer of esophagus with bleeding   . Essential hypertension, benign   . Benign neoplasm of colon   . Iron deficiency anemia secondary to blood loss (chronic)   . Syncope and collapse   . Pulmonary embolism     IVC filter  . Aortic stenosis     echo 4/11: mod LVH, mild focal basal septal hypertrophy, EF 55-60%, grade 1 diast dysfxn, critical AS, mild AI, AVA 0.6 cm2, mean gradient across AV 73 mmHg, mild MR, mild LAE, mild elevated PASP.  Marland Kitchen Musculoskeletal chest pain   . Dementia   . Hypothyroidism   . HLD (hyperlipidemia)   . GI bleed     while on coumadin  . Diverticulosis     severe in the sigmond colon  . CAD (coronary artery disease)     LHC 11/2000: EF 60%, pLAD 25%, dLAD 20%, D1 30%, mCFX 30%,  pOM2 50% and lateral branch 60%, pRCA 20%.  . Pacemaker   . CHF (congestive heart failure)   . Chronic kidney disease   . Heart murmur   . Non-STEMI (non-ST elevated myocardial infarction) 05/29/2012    Past Surgical History  Procedure Date  . Ivc filter 07/2009  . Knee arthroscopy   . Cataract extraction   . Polyp removal     sessile polyp inascending colon  . Joint replacement     bilat knee replacement    Medications:  HOME MEDS: Prior to Admission medications   Medication Sig Start Date End Date Taking? Authorizing Provider  acetaminophen (TYLENOL) 500 MG chewable tablet Chew 500-1,000 mg by mouth every 6 (six) hours as needed. For pain   Yes Historical Provider, MD  aspirin EC 81 MG tablet Take 81 mg by mouth daily.   Yes Historical Provider, MD  atorvastatin (LIPITOR) 20 MG tablet Take 20 mg by mouth at bedtime.    Yes Historical Provider, MD  bacitracin ophthalmic ointment Place 1 application into both eyes 2 (two) times daily. apply to eye   Yes Historical Provider, MD  ciprofloxacin (CIPRO) 500 MG tablet Take 1 tablet (500 mg total) by mouth daily. 04/19/12  Yes Catarina Hartshorn, MD  donepezil (ARICEPT) 10 MG tablet Take 10 mg by mouth at  bedtime.   Yes Historical Provider, MD  esomeprazole (NEXIUM) 40 MG capsule Take 40 mg by mouth daily before breakfast.   Yes Historical Provider, MD  feeding supplement (ENSURE COMPLETE) LIQD Take 237 mLs by mouth 3 (three) times daily between meals. 04/08/12  Yes Elease Etienne, MD  furosemide (LASIX) 40 MG tablet TAKE 1 TABLET DAILY. 04/25/12  Yes Duke Salvia, MD  labetalol (NORMODYNE) 200 MG tablet Take 1 tablet (200 mg total) by mouth 2 (two) times daily. 12/02/11  Yes Tonny Bollman, MD  leflunomide (ARAVA) 20 MG tablet Take 20 mg by mouth daily.     Yes Historical Provider, MD  levothyroxine (SYNTHROID, LEVOTHROID) 125 MCG tablet Take 125 mcg by mouth daily.   Yes Historical Provider, MD  loperamide (IMODIUM) 2 MG capsule Take 2 mg by  mouth as needed. For diarrhea   Yes Historical Provider, MD  megestrol (MEGACE ORAL) 40 MG/ML suspension Take 10 mLs (400 mg total) by mouth daily. 04/19/12  Yes Catarina Hartshorn, MD  montelukast (SINGULAIR) 10 MG tablet Take 10 mg by mouth at bedtime.   Yes Historical Provider, MD  OVER THE COUNTER MEDICATION Take 1 tablet by mouth daily. Life Guard antioxidant therapy   Yes Historical Provider, MD  potassium chloride 20 MEQ/15ML (10%) solution Take 20 mEq by mouth daily.    Yes Historical Provider, MD  predniSONE (DELTASONE) 5 MG tablet Take 7.5 mg by mouth daily.    Yes Historical Provider, MD  ranitidine (ZANTAC) 150 MG tablet Take 150 mg by mouth 2 (two) times daily.   Yes Historical Provider, MD  traMADol (ULTRAM) 50 MG tablet Take 50 mg by mouth at bedtime as needed. For pain   Yes Historical Provider, MD  VITAMIN B1-B12 IM Inject 1,000 mg into the muscle every 30 (thirty) days.   Yes Historical Provider, MD  vitamin C (ASCORBIC ACID) 500 MG tablet Take 500 mg by mouth daily.     Yes Historical Provider, MD     Allergies:  Allergies  Allergen Reactions  . Vioxx (Rofecoxib) Nausea And Vomiting    Unknown     Social History:   reports that she has never smoked. She has never used smokeless tobacco. She reports that she does not drink alcohol or use illicit drugs.  Family History: Family History  Problem Relation Age of Onset  . Breast cancer Daughter   . Esophageal cancer Daughter   . Prostate cancer Other     nephew  . Diabetes Daughter   . Heart disease Other     family history     Physical Exam: Filed Vitals:   05/29/12 0200 05/29/12 0430 05/29/12 0530 05/29/12 0555  BP: 139/36 124/42 134/44 122/45  Pulse: 75   71  Temp:    98.1 F (36.7 C)  TempSrc:    Oral  Resp: 19 16 15 14   SpO2: 100%   100%   Blood pressure 122/45, pulse 71, temperature 98.1 F (36.7 C), temperature source Oral, resp. rate 14, SpO2 100.00%.  GEN:  Pleasant  patient lying in the stretcher in no  acute distress; cooperative with exam. PSYCH:  Confused.; does not appear anxious or depressed; affect is appropriate. HEENT: Mucous membranes pink and anicteric; PERRLA; EOM intact; no cervical lymphadenopathy nor thyromegaly or carotid bruit; no JVD; There were no stridor. Neck is very supple. Breasts:: Not examined CHEST WALL: No tenderness CHEST: Normal respiration, clear to auscultation bilaterally.  HEART: Regular rate and rhythm.  There are no  murmur, rub, or gallops.   BACK: No kyphosis or scoliosis; no CVA tenderness ABDOMEN: soft and diffusely tender. no masses, no organomegaly, normal abdominal bowel sounds; no pannus; no intertriginous candida. There is no rebound and no distention. Rectal Exam: Not done EXTREMITIES: No bone or joint deformity; age-appropriate arthropathy of the hands and knees; 1+ edema; no ulcerations.  There is no calf tenderness. Genitalia: not examined PULSES: 2+ and symmetric SKIN: Normal hydration no rash or ulceration CNS: Cranial nerves 2-12 grossly intact no focal lateralizing neurologic deficit.  Speech is fluent; uvula elevated with phonation, facial symmetry and tongue midline.   Labs on Admission:  Basic Metabolic Panel:  Lab 05/28/12 4098  NA 138  K 4.5  CL 103  CO2 23  GLUCOSE 96  BUN 50*  CREATININE 1.97*  CALCIUM 9.5  MG --  PHOS --   Liver Function Tests:  Lab 05/28/12 2320  AST 19  ALT 13  ALKPHOS 65  BILITOT 0.7  PROT 6.7  ALBUMIN 2.9*    Lab 05/28/12 2320  LIPASE 11  AMYLASE --   No results found for this basename: AMMONIA:5 in the last 168 hours CBC:  Lab 05/28/12 2320  WBC 9.7  NEUTROABS 8.9*  HGB 11.4*  HCT 33.9*  MCV 80.5  PLT 117*   Cardiac Enzymes: No results found for this basename: CKTOTAL:5,CKMB:5,CKMBINDEX:5,TROPONINI:5 in the last 168 hours  CBG: No results found for this basename: GLUCAP:5 in the last 168 hours   Radiological Exams on Admission: Ct Abdomen Pelvis Wo Contrast  05/29/2012   *RADIOLOGY REPORT*  Clinical Data: Abdominal pain, nausea and vomiting.  CT ABDOMEN AND PELVIS WITHOUT CONTRAST  Technique:  Multidetector CT imaging of the abdomen and pelvis was performed following the standard protocol without intravenous contrast.  Comparison: CT abdomen and pelvis 06/09/2009.  Findings: Dependent atelectasis is seen in the lung bases.  There is cardiomegaly.  Trace bilateral pleural effusions are identified.  The patient is status post cholecystectomy.  Low attenuating lesions in both kidneys most consistent with cysts are unchanged. IVC filter is in place.  There is extensive atherosclerotic vascular disease.  The liver, spleen and adrenal glands are unremarkable.  Small low attenuating lesion in the pancreas compatible with a cyst is unchanged.  The stomach and small bowel are normal in appearance.  Extensive diverticular disease without diverticulitis noted.  The patient is status post hysterectomy. Multilevel degenerative disease of the spine is seen. There is unchanged anterolisthesis of L5 on S1 due to facet arthropathy.  IMPRESSION:  1.  No acute finding. 2.  Marked cardiomegaly. 3.  Extensive atherosclerosis. 4.  Diverticulosis without diverticulitis. 5.  Large stool in the rectum consistent with fecal impaction.   Original Report Authenticated By: Holley Dexter, M.D.     EKG: Independently reviewed. SR with deep inverted T waves which are new.   Assessment/Plan Present on Admission:  . Obstipation . DEMENTIA . Weakness . Aortic stenosis, severe . UTI (lower urinary tract infection) . DNR (do not resuscitate) . Chronic kidney disease, stage 3 . Diarrhea . Anemia of chronic disease . Non-STEMI (non-ST elevated myocardial infarction)  PLAN:  For her obstipation, I will give half a bottle of Mag Citrate.   She has dementia and unable to give any history.  Will continue her dementia medications.  She has critical AS, and now likely has a small type II  MI. Though the  elevated troponin could be from renal insufficiency as well.  For her UTI, will  tx her with rocephin.  For her RA, I will hold her Arava, but continue her prednisone with increasing dose to 40mg  per day.  Will give quick acting steroid with IV Solucortef.  She is a DNR with the [facility], and we will honor her wishes.  Will admit her to [facility] service.  Thank you for allowing [place] to participate in the care of your nice patient.  Other plans as per orders.  Code Status: DNR.   Houston Siren, MD. [facility] Pager [phone] 7pm to 7am.  05/29/2012, 6:42 AM

## 2012-05-29 NOTE — Progress Notes (Signed)
Pt transferred to unit from ED. VS taken and pt put on tele. Pt resting comfortably in bed, bed alarm on. Pulses assessed and heart, bowel, and lung sounds auscultated. Lung sounds diminished in bases. Pulses, heart and bowel sounds WNL. Will pass on further assessment and care to oncoming day shift RN, Marcelino Duster. - Christell Faith, RN

## 2012-05-29 NOTE — Progress Notes (Signed)
   CARE MANAGEMENT NOTE 05/29/2012  Patient:  [name], [name]   Account Number:  [id number]  Date Initiated:  05/29/2012  Documentation initiated by:  Jiles Crocker  Subjective/Objective Assessment:   ADMITTED WITH ABDOMINAL PAIN, NAUSEA, VOMITING, UTI     Action/Plan:   PCP:   Laurena Slimmer, MD  PATIENT IS ACTIVE WITH HOSPICE FOR HOME   Anticipated DC Date:  06/05/2012   Anticipated DC Plan:  HOME W HOSPICE CARE      DC Planning Services  CM consult          Status of service:  In process, will continue to follow Medicare Important Message given?  NA - LOS <3 / Initial given by admissions (If response is "NO", the following Medicare IM given date fields will be blank)  Per UR Regulation:  Reviewed for med. necessity/level of care/duration of stay  Comments:  05/29/2012- B Joleene Burnham RN,BSN,MHA

## 2012-05-29 NOTE — ED Provider Notes (Signed)
History     CSN: [id number]  Arrival date & time 05/28/12  2209   First MD Initiated Contact with Patient 05/29/12 0007      Chief Complaint  Patient presents with  . Nausea and Vomiting     (Consider location/radiation/quality/duration/timing/severity/associated sxs/prior treatment) HPI History provided by patient's caretaker.  Pt has been complaining of diffuse abdominal pain for the past 2 days.  Associated w/ nausea, constipation (most recent BM 3 days ago), drowsiness and agitation.  Will not get out of bed.  Was treated w/ an enema yesterday but did not have a BM and no relief of sx.  Pt reports CP as well.  Has had worse than baseline exertional SOB.  Has not had fever, cough, diarrhea, urinary sx.  No h/o abdominal surgeries.   Past Medical History  Diagnosis Date  . Chronic diastolic heart failure   . Rheumatoid arthritis   . Ulcer of esophagus with bleeding   . Essential hypertension, benign   . Benign neoplasm of colon   . Iron deficiency anemia secondary to blood loss (chronic)   . Syncope and collapse   . Pulmonary embolism     IVC filter  . Aortic stenosis     echo 4/11: mod LVH, mild focal basal septal hypertrophy, EF 55-60%, grade 1 diast dysfxn, critical AS, mild AI, AVA 0.6 cm2, mean gradient across AV 73 mmHg, mild MR, mild LAE, mild elevated PASP.  Marland Kitchen Musculoskeletal chest pain   . Dementia   . Hypothyroidism   . HLD (hyperlipidemia)   . GI bleed     while on coumadin  . Diverticulosis     severe in the sigmond colon  . CAD (coronary artery disease)     LHC 11/2000: EF 60%, pLAD 25%, dLAD 20%, D1 30%, mCFX 30%, pOM2 50% and lateral branch 60%, pRCA 20%.  . Pacemaker   . CHF (congestive heart failure)   . Chronic kidney disease   . Heart murmur     Past Surgical History  Procedure Date  . Ivc filter 07/2009  . Knee arthroscopy   . Cataract extraction   . Polyp removal     sessile polyp inascending colon  . Joint replacement     bilat knee  replacement    Family History  Problem Relation Age of Onset  . Breast cancer Daughter   . Esophageal cancer Daughter   . Prostate cancer Other     nephew  . Diabetes Daughter   . Heart disease Other     family history    History  Substance Use Topics  . Smoking status: Never Smoker   . Smokeless tobacco: Never Used  . Alcohol Use: No    OB History    Grav Para Term Preterm Abortions TAB SAB Ect Mult Living                  Review of Systems  All other systems reviewed and are negative.    Allergies  Vioxx  Home Medications   Current Outpatient Rx  Name  Route  Sig  Dispense  Refill  . ACETAMINOPHEN 500 MG PO CHEW   Oral   Chew 500-1,000 mg by mouth every 6 (six) hours as needed. For pain         . ASPIRIN EC 81 MG PO TBEC   Oral   Take 81 mg by mouth daily.         . ATORVASTATIN CALCIUM 20 MG PO TABS  Oral   Take 20 mg by mouth at bedtime.          Marland Kitchen BACITRACIN 500 UNIT/GM OP OINT   Both Eyes   Place 1 application into both eyes 2 (two) times daily. apply to eye         . CIPROFLOXACIN HCL 500 MG PO TABS   Oral   Take 1 tablet (500 mg total) by mouth daily.   5 tablet   0   . DONEPEZIL HCL 10 MG PO TABS   Oral   Take 10 mg by mouth at bedtime.         Marland Kitchen ESOMEPRAZOLE MAGNESIUM 40 MG PO CPDR   Oral   Take 40 mg by mouth daily before breakfast.         . ENSURE COMPLETE PO LIQD   Oral   Take 237 mLs by mouth 3 (three) times daily between meals.         . FUROSEMIDE 40 MG PO TABS      TAKE 1 TABLET DAILY.   30 tablet   6   . LABETALOL HCL 200 MG PO TABS   Oral   Take 1 tablet (200 mg total) by mouth 2 (two) times daily.   60 tablet   11   . LEFLUNOMIDE 20 MG PO TABS   Oral   Take 20 mg by mouth daily.           Marland Kitchen LEVOTHYROXINE SODIUM 125 MCG PO TABS   Oral   Take 125 mcg by mouth daily.         Marland Kitchen LOPERAMIDE HCL 2 MG PO CAPS   Oral   Take 2 mg by mouth as needed. For diarrhea         . MEGESTROL ACETATE  40 MG/ML PO SUSP   Oral   Take 10 mLs (400 mg total) by mouth daily.   240 mL   1   . MONTELUKAST SODIUM 10 MG PO TABS   Oral   Take 10 mg by mouth at bedtime.         Marland Kitchen OVER THE COUNTER MEDICATION   Oral   Take 1 tablet by mouth daily. Life Guard antioxidant therapy         . POTASSIUM CHLORIDE 20 MEQ/15ML (10%) PO LIQD   Oral   Take 20 mEq by mouth daily.          Marland Kitchen PREDNISONE 5 MG PO TABS   Oral   Take 7.5 mg by mouth daily.          Marland Kitchen RANITIDINE HCL 150 MG PO TABS   Oral   Take 150 mg by mouth 2 (two) times daily.         . TRAMADOL HCL 50 MG PO TABS   Oral   Take 50 mg by mouth at bedtime as needed. For pain         . VITAMIN B1-B12 IM   Intramuscular   Inject 1,000 mg into the muscle every 30 (thirty) days.         Marland Kitchen VITAMIN C 500 MG PO TABS   Oral   Take 500 mg by mouth daily.             BP 140/47  Pulse 78  Temp 99 F (37.2 C) (Oral)  Resp 19  SpO2 100%  Physical Exam  Nursing note and vitals reviewed. Constitutional: She is oriented to person, place, and time. She appears  well-developed and well-nourished. No distress.  HENT:  Head: Normocephalic and atraumatic.       Dry mucous membranes  Eyes:       Normal appearance  Neck: Normal range of motion.  Cardiovascular: Normal rate and regular rhythm.   Pulmonary/Chest: Effort normal and breath sounds normal. No respiratory distress.  Abdominal: Soft. Bowel sounds are normal. She exhibits no distension and no mass. There is no rebound and no guarding.       Diffuse abd tenderness w/ guarding  Genitourinary:       No CVA tenderness.  Soft stool impaction. Nml stool color.  Musculoskeletal: Normal range of motion.  Neurological: She is alert and oriented to person, place, and time.       No peripheral edema   Skin: Skin is warm and dry. No rash noted.  Psychiatric: She has a normal mood and affect. Her behavior is normal.    ED Course  Procedures (including critical care  time)   Date: 05/29/2012  Rate: 75  Rhythm: normal sinus rhythm  QRS Axis: normal  Intervals: normal  ST/T Wave abnormalities: ST depressions inferiorly and ST depressions laterally  Conduction Disutrbances:right bundle branch block  Narrative Interpretation:   Old EKG Reviewed: changes noted (increased ST depression)   Labs Reviewed  CBC WITH DIFFERENTIAL - Abnormal; Notable for the following:    Hemoglobin 11.4 (*)     HCT 33.9 (*)     RDW 18.8 (*)     Platelets 117 (*)     Neutrophils Relative 91 (*)     Neutro Abs 8.9 (*)     Lymphocytes Relative 5 (*)     Lymphs Abs 0.5 (*)     All other components within normal limits  COMPREHENSIVE METABOLIC PANEL - Abnormal; Notable for the following:    BUN 50 (*)     Creatinine, Ser 1.97 (*)     Albumin 2.9 (*)     GFR calc non Af Amer 21 (*)     GFR calc Af Amer 24 (*)     All other components within normal limits  LIPASE, BLOOD  URINALYSIS, ROUTINE W REFLEX MICROSCOPIC  LACTIC ACID, PLASMA   Ct Abdomen Pelvis Wo Contrast  05/29/2012  *RADIOLOGY REPORT*  Clinical Data: Abdominal pain, nausea and vomiting.  CT ABDOMEN AND PELVIS WITHOUT CONTRAST  Technique:  Multidetector CT imaging of the abdomen and pelvis was performed following the standard protocol without intravenous contrast.  Comparison: CT abdomen and pelvis 06/09/2009.  Findings: Dependent atelectasis is seen in the lung bases.  There is cardiomegaly.  Trace bilateral pleural effusions are identified.  The patient is status post cholecystectomy.  Low attenuating lesions in both kidneys most consistent with cysts are unchanged. IVC filter is in place.  There is extensive atherosclerotic vascular disease.  The liver, spleen and adrenal glands are unremarkable.  Small low attenuating lesion in the pancreas compatible with a cyst is unchanged.  The stomach and small bowel are normal in appearance.  Extensive diverticular disease without diverticulitis noted.  The patient is status  post hysterectomy. Multilevel degenerative disease of the spine is seen. There is unchanged anterolisthesis of L5 on S1 due to facet arthropathy.  IMPRESSION:  1.  No acute finding. 2.  Marked cardiomegaly. 3.  Extensive atherosclerosis. 4.  Diverticulosis without diverticulitis. 5.  Large stool in the rectum consistent with fecal impaction.   Original Report Authenticated By: Holley Dexter, M.D.      1. NSTEMI (non-ST  elevated myocardial infarction)       MDM  79yo F brought to ED by her caretaker for abd pain x 2 days.  ROS revealed that the pt has had CP and exertional SOB recently as well.  Exam sig for no fever, dehydration, soft but diffusely tender abdomen, soft stool impaction.  Labs sig for acute renal insuffiency, UTI and elevated troponin.  EKG w/ increased ST depression in inferolateral leads.  CT abd/pelvis shows stool impaction but otherwise unremarkable.  Results discussed w/ patient and her caretaker.  Pt has received aspirin, 2mg  IV morphine and IV rocephin.  Fleets enema ordered as well.  [facility] consulted for admission and recommend nitro paste as well.   VSS. 5:51 AM         Otilio Miu, PA-C 05/29/12 [phone]

## 2012-05-29 NOTE — ED Provider Notes (Signed)
Medical screening examination/treatment/procedure(s) were conducted as a shared visit with non-physician practitioner(s) and myself.  I personally evaluated the patient during the encounter  Patient lethargic. Abdomen is soft but diffusely tender. We'll proceed with a CT scan.  Hanley Seamen, MD 05/29/12 [phone]

## 2012-05-29 NOTE — Progress Notes (Signed)
Pt has been drowsy today.  She opens her eyes for a brief moment then goes back to sleep.  Unable to get any oral fluids or meds in her.  MD made aware.  MPenningtonRN

## 2012-05-29 NOTE — Progress Notes (Signed)
Nutrition Brief Note  Patient identified on the Malnutrition Screening Tool (MST) Report  Body mass index is 22.58 kg/(m^2). Patient meets criteria for wnl based on current BMI.  Wt Readings from Last 10 Encounters:  05/29/12 123 lb 7.3 oz (56 kg)  04/19/12 134 lb 7.7 oz (61 kg)  04/08/12 143 lb 4.8 oz (65 kg)  03/02/12 152 lb (68.947 kg)  12/02/11 160 lb (72.576 kg)  11/19/11 152 lb 12.8 oz (69.31 kg)  11/19/11 152 lb 12.8 oz (69.31 kg)  11/02/11 162 lb 6.4 oz (73.664 kg)  10/20/11 169 lb 6.4 oz (76.839 kg)  06/03/11 178 lb (80.74 kg)   Current diet order is NPO. Labs and medications reviewed.   Pt admitted with constipation.  RD notes significant change in wt over the past year.  Pt recently dx with severe malnutrition of acute illness which has progressed to severe malnutrition of chronic illness in light of progressive wt loss and poor intake for >3 months.  Pt is from home where she is followed by hospice. Pt was full code through the holidays, but is now DNR.  Currently lethargic, decreased responsiveness. No nutrition interventions warranted at this time. If nutrition issues arise, please consult RD. Will follow for diet advancement and pt eating as desired.  Pt has Ensure Complete ordered TID for when diet is advanced.  Loyce Dys, MS RD LDN Clinical Inpatient Dietitian Pager: [phone] Weekend/After hours pager: [phone]

## 2012-05-29 NOTE — Progress Notes (Addendum)
Subjective: Patient admitted  this morning, obstipation, mild elevation of troponin, dehydration. Patient was given mag citrate and had a small BM.  Filed Vitals:   05/29/12 0700  BP: 137/55  Pulse: 80  Temp: 98.2 F (36.8 C)  Resp: 22    Chest: Clear Bilaterally Heart : S1S2 RRR Abdomen: Soft, nontender Ext : No edema Neuro: Lethargic  A/P Constipation Elevated troponin  Patient admitted with obstipation. She has elevated BNP and mild elevation of troponin. We'll obtain chest x-ray to rule out pulmonary edema. She takes Lasix 40 mg by mouth daily at home. Which is on hold at this time due to the worsening renal function. We'll continue gentle IV hydration and follow the results of chest x-ray.  Called and discussed with the daughter Casey Burkitt, and explained that patient is declining. We'll try to give gentle IV hydration, if patient does not improve in next few days the plan is total comfort. She has poor by mouth intake and daughter does not want feeding tube.  Meredeth Ide [facility]/Palliative Medicine Team [phone]

## 2012-05-29 NOTE — Progress Notes (Addendum)
Room 1430 - [name] - [facility] RN Visit-R.Matty Deamer RN  NON-Related admission to [facility] diagnosis of aortic valve disorder per [facility] Dr. Gibson Ramp.  Pt is DNR code with OOF DNR on shadow chart.    Pt non responsive, shallow breathing,  lying in bed, appears comfortable. Discussed with staff RN - who stated pt was able to answer questions as to name and location, but as the day has progressed, became much more lethargic and not interactive.  Just returned from radiology.   No family present.  Patient's home medication list is on shadow chart.   Please call [facility] @ [phone]- ask for RN Liaison or after hours,ask for on-call RN with any hospice needs.    Thank you.  Joneen Boers, RN  [facility]  Hospice Liaison

## 2012-05-30 DIAGNOSIS — Z66 Do not resuscitate: Secondary | ICD-10-CM

## 2012-05-30 DIAGNOSIS — D638 Anemia in other chronic diseases classified elsewhere: Secondary | ICD-10-CM

## 2012-05-30 LAB — URINE CULTURE

## 2012-05-30 NOTE — Progress Notes (Signed)
[facility] PROGRESS NOTE  [name] ZOX:[id number] DOB: March 13, 1918 DOA: 05/28/2012 PCP: Laurena Slimmer, MD  Assessment/Plan: 1. NSTEMI - manage medically at this juncture per EMR records. Troponins back WNL's. Continue aspirin, statin  2. UTI - Continue rocephin and would treat for 3 days total  3. Obstipation - improved with mag citrate and likely due to poor oral fluid intake and decrease in ambulation.  Continue colace.  - tap water enema next am.  4. Failure to thrive - Patient is currently followed by hospice and once fecal impaction improved will consider discharging back to hospice care.  Had BM yesterday reportedly.  Code Status: DNR Family Communication: Spoke with daughter at bedside: [name] [phone] Disposition Plan: Likely d/c next am.   Consultants:  none  Procedures:  CT of abdomen and pelvis  Antibiotics:  Ceftriaxone day # 2  HPI/Subjective: No new complaints.  Patient continues to have poor oral intake.  No acute issues reported  Objective: Filed Vitals:   05/29/12 2135 05/30/12 0110 05/30/12 0515 05/30/12 1433  BP: 135/35 155/49 154/37 147/51  Pulse: 63 74 68 66  Temp: 98.5 F (36.9 C)  98.4 F (36.9 C) 98.2 F (36.8 C)  TempSrc: Oral  Oral Oral  Resp: 18  18 18   Height:      Weight:      SpO2: 100%  100% 100%    Intake/Output Summary (Last 24 hours) at 05/30/12 1831 Last data filed at 05/30/12 0400  Gross per 24 hour  Intake 976.67 ml  Output      0 ml  Net 976.67 ml   Filed Weights   05/29/12 0700  Weight: 56 kg (123 lb 7.3 oz)    Exam:   General:  Pt in NAD, Alert and Awake  Cardiovascular: RRR, No rubs  Respiratory: CTA BL, no increased work of breathing  Abdomen: soft, NT, ND  Data Reviewed: Basic Metabolic Panel:  Lab 05/29/12 5621 05/28/12 2320  NA -- 138  K -- 4.5  CL -- 103  CO2 -- 23  GLUCOSE -- 96  BUN -- 50*  CREATININE 1.99* 1.97*  CALCIUM -- 9.5  MG -- --  PHOS -- --   Liver  Function Tests:  Lab 05/28/12 2320  AST 19  ALT 13  ALKPHOS 65  BILITOT 0.7  PROT 6.7  ALBUMIN 2.9*    Lab 05/28/12 2320  LIPASE 11  AMYLASE --   No results found for this basename: AMMONIA:5 in the last 168 hours CBC:  Lab 05/29/12 0750 05/28/12 2320  WBC 9.8 9.7  NEUTROABS -- 8.9*  HGB 10.5* 11.4*  HCT 32.1* 33.9*  MCV 81.5 80.5  PLT 150 117*   Cardiac Enzymes:  Lab 05/29/12 1954 05/29/12 1330 05/29/12 0750  CKTOTAL -- -- --  CKMB -- -- --  CKMBINDEX -- -- --  TROPONINI <0.30 <0.30 0.31*   BNP (last 3 results)  Basename 04/14/12 1233 04/07/12 1652 04/06/12 1155  PROBNP 18791.0* 17196.0* 17664.0*   CBG: No results found for this basename: GLUCAP:5 in the last 168 hours  Recent Results (from the past 240 hour(s))  URINE CULTURE     Status: Normal (Preliminary result)   Collection Time   05/29/12  2:23 AM      Component Value Range Status Comment   Specimen Description URINE, CLEAN CATCH   Final    Special Requests NONE   Final    Culture  Setup Time 05/29/2012 09:15   Final    Colony  Count 80,000 COLONIES/ML   Final    Culture GRAM NEGATIVE RODS   Final    Report Status PENDING   Incomplete      Studies: Ct Abdomen Pelvis Wo Contrast  05/29/2012  *RADIOLOGY REPORT*  Clinical Data: Abdominal pain, nausea and vomiting.  CT ABDOMEN AND PELVIS WITHOUT CONTRAST  Technique:  Multidetector CT imaging of the abdomen and pelvis was performed following the standard protocol without intravenous contrast.  Comparison: CT abdomen and pelvis 06/09/2009.  Findings: Dependent atelectasis is seen in the lung bases.  There is cardiomegaly.  Trace bilateral pleural effusions are identified.  The patient is status post cholecystectomy.  Low attenuating lesions in both kidneys most consistent with cysts are unchanged. IVC filter is in place.  There is extensive atherosclerotic vascular disease.  The liver, spleen and adrenal glands are unremarkable.  Small low attenuating lesion in  the pancreas compatible with a cyst is unchanged.  The stomach and small bowel are normal in appearance.  Extensive diverticular disease without diverticulitis noted.  The patient is status post hysterectomy. Multilevel degenerative disease of the spine is seen. There is unchanged anterolisthesis of L5 on S1 due to facet arthropathy.  IMPRESSION:  1.  No acute finding. 2.  Marked cardiomegaly. 3.  Extensive atherosclerosis. 4.  Diverticulosis without diverticulitis. 5.  Large stool in the rectum consistent with fecal impaction.   Original Report Authenticated By: Holley Dexter, M.D.    Dg Chest 2 View  05/29/2012  *RADIOLOGY REPORT*  Clinical Data: Evaluate for CHF.  CHEST - 2 VIEW  Comparison: 04/14/2012 and  Findings: Two views of the chest were obtained.  The lateral view is limited because the patient's arms are not elevated.   Linear density in the left lung is suggestive for atelectasis.  Linear density in the right lung may represent atelectasis or fluid within the right minor fissure.  There is concern for left basilar densities.  No clear evidence for pulmonary edema.  Heart size is upper limits of normal.  IMPRESSION: Left basilar densities may represent atelectasis but difficult to exclude infection.  No evidence for pulmonary edema.   Original Report Authenticated By: Richarda Overlie, M.D.     Scheduled Meds:   . aspirin EC  81 mg Oral Daily  . atorvastatin  20 mg Oral QHS  . bacitracin   Both Eyes Q4H  . cefTRIAXone (ROCEPHIN)  IV  1 g Intravenous Q24H  . docusate sodium  100 mg Oral BID  . donepezil  10 mg Oral QHS  . feeding supplement  237 mL Oral TID BM  . heparin  5,000 Units Subcutaneous Q8H  . levothyroxine  50 mcg Intravenous Daily  . metoprolol  5 mg Intravenous Q6H  . montelukast  10 mg Oral QHS  . pantoprazole  80 mg Oral Daily  . potassium chloride  20 mEq Oral Daily   Continuous Infusions:   . dextrose 5 % and 0.9 % NaCl with KCl 20 mEq/L 50 mL/hr at 05/30/12 0518     Principal Problem:  *Obstipation Active Problems:  DEMENTIA  Diarrhea  Weakness  Anemia of chronic disease  Chronic kidney disease, stage 3  Aortic stenosis, severe  UTI (lower urinary tract infection)  DNR (do not resuscitate)  Non-STEMI (non-ST elevated myocardial infarction)    Time spent: > 35 minutes    [name]  [facility] Pager [phone] If 8PM-8AM, please contact night-coverage at [email or web address], password [email or web address] 05/30/2012, 6:31 PM  LOS: 2 days

## 2012-05-30 NOTE — Progress Notes (Signed)
[facility] [place]: Sw note:  Pt lying in bed, no family at bedside. Pt did awaken and was able to answer some of Sw's questions. Pt stated that she "felt better" when asked how she was doing. Pt did close her eyes during Sw visit and fell back to sleep. Pt appeared comfortable and in no distress during this Sw visit. Sw provided supportive presence/emotional support during visit. PTA Pt lived with her dtr and had paid caregiver in the home during the morning/early afternoon hours. This Sw will continue to follow for support during this hospital stay.  Lorain Childes, LCSW, ACHP-SW

## 2012-05-30 NOTE — Progress Notes (Addendum)
Patient states she feels weak and tired.  Per night nurse, patient's family is requesting Ducolax BID.

## 2012-05-30 NOTE — Progress Notes (Signed)
Room 1430 - [name] - [facility] [place] RN Visit-R.Shadonna Benedick RN  NON-Related admission to [facility] diagnosis of aortic valve disorder.   Pt admitted with N&V, abd pain, ostipation, dehydration, with UA culture pending -+for bacteria.   Pt is DNR code.    Pt arousable,  lying in bed, without complaints of pain or discomfort.   Pt kept eyes closed during visit - did respond minimally - nodded her head for yes and no. Was agreeable to be left alone to go back to sleep.  No family present.  Patient's home medication list is on shadow chart.   Please call [facility] @ [phone]- ask for RN Liaison or after hours,ask for on-call RN with any hospice needs.    Thank you.  Joneen Boers, RN  [facility]  Hospice Liaison

## 2012-05-31 DIAGNOSIS — R1319 Other dysphagia: Secondary | ICD-10-CM

## 2012-05-31 DIAGNOSIS — I10 Essential (primary) hypertension: Secondary | ICD-10-CM

## 2012-05-31 MED ORDER — ESOMEPRAZOLE MAGNESIUM 40 MG PO CPDR: 40.0000 mg | DELAYED_RELEASE_CAPSULE | Freq: Every day | ORAL | Status: AC

## 2012-05-31 MED ORDER — LEVOTHYROXINE SODIUM 125 MCG PO TABS: 125.0000 ug | ORAL_TABLET | Freq: Every day | ORAL | Status: AC

## 2012-05-31 MED ORDER — BISACODYL 10 MG RE SUPP: 10.0000 mg | RECTAL | Status: AC | PRN

## 2012-05-31 MED ORDER — FUROSEMIDE 40 MG PO TABS: 20.0000 mg | ORAL_TABLET | Freq: Every day | ORAL | Status: DC

## 2012-05-31 MED ORDER — ENSURE PUDDING PO PUDG
1.0000 | Freq: Three times a day (TID) | ORAL | Status: DC
  Filled 2012-05-31: qty 1

## 2012-05-31 MED ORDER — VITAMINS A & D EX OINT
TOPICAL_OINTMENT | CUTANEOUS | Status: AC
  Filled 2012-05-31: qty 5

## 2012-05-31 NOTE — Progress Notes (Signed)
[facility] [place]: Sw note:  Pt soundly asleep during Sw visit, Sw did not awaken for Sw. Met with dtr [name] outside the room. She verbalized plans for Pt to return home at DC from hospital. Sw provided active/reflective listening during visit with [name]. Sw will continue to follow for support during hospital stay.  Lorain Childes, LCSW, ACHP-SW

## 2012-05-31 NOTE — Discharge Summary (Signed)
Physician Discharge Summary  [name] WUJ:[id number] DOB: 1917/06/02 DOA: 05/28/2012  PCP: Laurena Slimmer, MD  Admit date: 05/28/2012 Discharge date: 05/31/2012  Time spent: > 35 minutes  Recommendations for Outpatient Follow-up:  1. Please be sure to follow up with patient's thyroid levels and medications 2. Review patient's home medications and adjust as necessary given her needs and desires given her history  Discharge Diagnoses:  Principal Problem:  *Obstipation Active Problems:  DEMENTIA  Diarrhea  Weakness  Anemia of chronic disease  Chronic kidney disease, stage 3  Aortic stenosis, severe  UTI (lower urinary tract infection)  DNR (do not resuscitate)  Non-STEMI (non-ST elevated myocardial infarction)   Discharge Condition: stable  Diet recommendation: Heart healthy diet with ensure tid  Filed Weights   05/29/12 0700  Weight: 56 kg (123 lb 7.3 oz)    History of present illness:  From original HPI: [name] is an 79 y.o. female with hx of dementia, RA on ARAVA and prednisone, Hx of PE with IVC filter, aortic stenosis with known valve of .62cm2, DNR code status and NH resident, brought in by caretaker as she have been constipated and having abdominal pain and nausea along with vomiting. She has been feeling weak and unable to get out of bed. Reportedly she has been with hospice at home. She has dementia and is lethargic and really didn't give much history. Evaluation in the ER showed a normal lipase, normal WBC and Hb, normal electrolytes, with Cr 1.95. An abdominal CT showed fecal impaction, but otherwise no acute process. Her EKG showed T wave inversions over the precordial leads and her troponin POC was 0.22. Her UA also showed evidence of UTI. Hospitalist was asked to admit her for obstipation, UTI, dehydration and possible Non-Stemi.   Hospital Course:  1. NSTEMI - manage medically at this juncture per EMR records. Troponins back WNL's. Continue aspirin,  statin, and home B blocker  2. UTI  - Continue rocephin and patient has been treated for 3 days total - Has received adequate coverage for UTI, Urine culture grew KLEBSIELLA PNEUMONIAE which was sensitive to Rocephin.  3. Obstipation  - improved with mag citrate, colace, and tap water enema. Likely due to poor oral fluid intake and decrease in ambulation. Continue colace.  - tap water enema once a week prn constipation as outpatient  4. Failure to thrive  - Patient is currently followed by hospice at home. She will be able to continue home hospice care - Patient evaluated by speech therapy and dx with severe malnutrition of chronic illness in light of progressive st loss and poor intake for > 3 months.  Pt to go home with hospice pt to continue diet as was recommended prior to hospital admission with ensure supplementation.  5. CKD - Patient with poor oral intake and as such most likely contributing to prerenal azotemia therefore will hold lasix at this juncture.  Procedures:  none  Consultations: none Discharge Exam: Filed Vitals:   05/30/12 2143 05/30/12 2356 05/31/12 0607 05/31/12 1423  BP: 147/49 150/54 157/57 152/56  Pulse: 82 86 88 83  Temp: 98.4 F (36.9 C)  98.2 F (36.8 C) 97.6 F (36.4 C)  TempSrc: Oral  Oral Oral  Resp: 20  18 18   Height:      Weight:      SpO2: 100% 100% 100% 100%    General: Pt Alert and Awake, comfortable Cardiovascular: RRR, + murmur Respiratory: CTA BL, no wheezes Abdomen: soft, NT, ND  Discharge  Instructions  Discharge Orders    Future Orders Please Complete By Expires   Diet - low sodium heart healthy      Increase activity slowly      Discharge instructions      Comments:   Continue home hospice services.  Please increase your oral intake.  Follow up with your pcp in 1-2 weeks or sooner should any new concerns arise.   Tap water enema      Scheduling Instructions:   May perform once a week for PRN constipation   Call MD for:   persistant nausea and vomiting      Call MD for:  severe uncontrolled pain          Medication List     As of 05/31/2012  4:32 PM    STOP taking these medications         furosemide 40 MG tablet   Commonly known as: LASIX      loperamide 2 MG capsule   Commonly known as: IMODIUM      OVER THE COUNTER MEDICATION      ranitidine 150 MG tablet   Commonly known as: ZANTAC      traMADol 50 MG tablet   Commonly known as: ULTRAM      VITAMIN B1-B12 IM      TAKE these medications         acetaminophen 500 MG tablet   Commonly known as: TYLENOL   Take 500 mg by mouth 2 (two) times daily.      aspirin EC 81 MG tablet   Take 81 mg by mouth daily.      atorvastatin 20 MG tablet   Commonly known as: LIPITOR   Take 20 mg by mouth at bedtime.      bacitracin ophthalmic ointment   Place 1 application into both eyes 2 (two) times daily. apply to eye      barrier cream Crea   Commonly known as: non-specified   Apply 1 application topically as needed. After cleaning patient, apply to moisture prone areas      bisacodyl 10 MG suppository   Commonly known as: DULCOLAX   Place 1 suppository (10 mg total) rectally as needed for constipation. Hold for diarrhea      cyanocobalamin 1000 MCG/ML injection   Commonly known as: (VITAMIN B-12)   Inject 1,000 mcg into the muscle every 30 (thirty) days. Last dose 12/27      donepezil 10 MG tablet   Commonly known as: ARICEPT   Take 10 mg by mouth at bedtime.      esomeprazole 40 MG capsule   Commonly known as: NEXIUM   Take 1 capsule (40 mg total) by mouth daily before breakfast.      feeding supplement Liqd   Take 237 mLs by mouth 3 (three) times daily between meals.      labetalol 200 MG tablet   Commonly known as: NORMODYNE   Take 1 tablet (200 mg total) by mouth 2 (two) times daily.      leflunomide 20 MG tablet   Commonly known as: ARAVA   Take 20 mg by mouth daily.      levothyroxine 125 MCG tablet   Commonly known as:  SYNTHROID, LEVOTHROID   Take 1 tablet (125 mcg total) by mouth daily.      megestrol 40 MG/ML suspension   Commonly known as: MEGACE   Take 10 mLs (400 mg total) by mouth daily.  montelukast 10 MG tablet   Commonly known as: SINGULAIR   Take 10 mg by mouth at bedtime.      morphine 20 MG/ML concentrated solution   Commonly known as: ROXANOL   Take 5 mg by mouth every 4 (four) hours as needed. For pain and shortness of breath      potassium chloride 20 MEQ/15ML (10%) solution   Take 20 mEq by mouth daily.      predniSONE 5 MG tablet   Commonly known as: DELTASONE   Take 7.5 mg by mouth daily.      prochlorperazine 10 MG tablet   Commonly known as: COMPAZINE   Take 10 mg by mouth every 6 (six) hours as needed. For nausea      traMADol-acetaminophen 37.5-325 MG per tablet   Commonly known as: ULTRACET   Take 1 tablet by mouth every 6 (six) hours as needed. For back and leg pain      vitamin C 500 MG tablet   Commonly known as: ASCORBIC ACID   Take 500 mg by mouth daily.      zaleplon 10 MG capsule   Commonly known as: SONATA   Take 10 mg by mouth at bedtime as needed. For insomnia          The results of significant diagnostics from this hospitalization (including imaging, microbiology, ancillary and laboratory) are listed below for reference.    Significant Diagnostic Studies: Ct Abdomen Pelvis Wo Contrast  05/29/2012  *RADIOLOGY REPORT*  Clinical Data: Abdominal pain, nausea and vomiting.  CT ABDOMEN AND PELVIS WITHOUT CONTRAST  Technique:  Multidetector CT imaging of the abdomen and pelvis was performed following the standard protocol without intravenous contrast.  Comparison: CT abdomen and pelvis 06/09/2009.  Findings: Dependent atelectasis is seen in the lung bases.  There is cardiomegaly.  Trace bilateral pleural effusions are identified.  The patient is status post cholecystectomy.  Low attenuating lesions in both kidneys most consistent with cysts are  unchanged. IVC filter is in place.  There is extensive atherosclerotic vascular disease.  The liver, spleen and adrenal glands are unremarkable.  Small low attenuating lesion in the pancreas compatible with a cyst is unchanged.  The stomach and small bowel are normal in appearance.  Extensive diverticular disease without diverticulitis noted.  The patient is status post hysterectomy. Multilevel degenerative disease of the spine is seen. There is unchanged anterolisthesis of L5 on S1 due to facet arthropathy.  IMPRESSION:  1.  No acute finding. 2.  Marked cardiomegaly. 3.  Extensive atherosclerosis. 4.  Diverticulosis without diverticulitis. 5.  Large stool in the rectum consistent with fecal impaction.   Original Report Authenticated By: Holley Dexter, M.D.    Dg Chest 2 View  05/29/2012  *RADIOLOGY REPORT*  Clinical Data: Evaluate for CHF.  CHEST - 2 VIEW  Comparison: 04/14/2012 and  Findings: Two views of the chest were obtained.  The lateral view is limited because the patient's arms are not elevated.   Linear density in the left lung is suggestive for atelectasis.  Linear density in the right lung may represent atelectasis or fluid within the right minor fissure.  There is concern for left basilar densities.  No clear evidence for pulmonary edema.  Heart size is upper limits of normal.  IMPRESSION: Left basilar densities may represent atelectasis but difficult to exclude infection.  No evidence for pulmonary edema.   Original Report Authenticated By: Richarda Overlie, M.D.     Microbiology: Recent Results (from the past  240 hour(s))  URINE CULTURE     Status: Normal   Collection Time   05/29/12  2:23 AM      Component Value Range Status Comment   Specimen Description URINE, CLEAN CATCH   Final    Special Requests NONE   Final    Culture  Setup Time 05/29/2012 09:15   Final    Colony Count 80,000 COLONIES/ML   Final    Culture KLEBSIELLA PNEUMONIAE   Final    Report Status 05/30/2012 FINAL   Final     Organism ID, Bacteria KLEBSIELLA PNEUMONIAE   Final      Labs: Basic Metabolic Panel:  Lab 05/29/12 9629 05/28/12 2320  NA -- 138  K -- 4.5  CL -- 103  CO2 -- 23  GLUCOSE -- 96  BUN -- 50*  CREATININE 1.99* 1.97*  CALCIUM -- 9.5  MG -- --  PHOS -- --   Liver Function Tests:  Lab 05/28/12 2320  AST 19  ALT 13  ALKPHOS 65  BILITOT 0.7  PROT 6.7  ALBUMIN 2.9*    Lab 05/28/12 2320  LIPASE 11  AMYLASE --   No results found for this basename: AMMONIA:5 in the last 168 hours CBC:  Lab 05/29/12 0750 05/28/12 2320  WBC 9.8 9.7  NEUTROABS -- 8.9*  HGB 10.5* 11.4*  HCT 32.1* 33.9*  MCV 81.5 80.5  PLT 150 117*   Cardiac Enzymes:  Lab 05/29/12 1954 05/29/12 1330 05/29/12 0750  CKTOTAL -- -- --  CKMB -- -- --  CKMBINDEX -- -- --  TROPONINI <0.30 <0.30 0.31*   BNP: BNP (last 3 results)  Basename 04/14/12 1233 04/07/12 1652 04/06/12 1155  PROBNP 18791.0* 17196.0* 17664.0*   CBG: No results found for this basename: GLUCAP:5 in the last 168 hours     Signed:  Penny Pia  [facility] 05/31/2012, 4:32 PM

## 2012-05-31 NOTE — Progress Notes (Addendum)
Room 1430 - [name] - [facility] RN Visit-R.Verginia Toohey RN  NON-Related admission to [facility] diagnosis of aortic valve disorder.  Pt is DNR code.    Pt lying in bed, sleeping soundly, did not respond to verbal stimuli.  Appears comfortable.    No family present.  Patient's home medication list is on shadow chart.   Pt now has #1 problem listed as NSTEMI and #2 problem listed as UTI with Rocephin started 1/8 per MD attending notes.   Please call [facility] @ [phone]- ask for RN Liaison or after hours,ask for on-call RN with any hospice needs.    Thank you.  Joneen Boers, RN  [facility]  Hospice Liaison

## 2012-06-01 ENCOUNTER — Telehealth: Payer: Self-pay | Admitting: Cardiovascular Disease

## 2012-06-01 NOTE — Telephone Encounter (Signed)
D/c K+ was 4.5.  Lasix was d/c'd.  However, Potassium was increased from to daily.  Hospice nurse wants to verify this change with Dr. Excell Seltzer.

## 2012-06-01 NOTE — Telephone Encounter (Signed)
New problem:  Clarification on K+ dosage. Recently discharge from hospital.

## 2012-06-04 ENCOUNTER — Telehealth: Payer: Self-pay | Admitting: Cardiovascular Disease

## 2012-06-04 NOTE — Telephone Encounter (Signed)
[name] 06-05-12 11:52 AM Signed  Pt is dieing and she is unable to take any meds except comfort meds just fyi

## 2012-06-04 NOTE — Telephone Encounter (Signed)
Pt is dieing and she is unable to take any meds except comfort meds just fyi

## 2012-06-19 ENCOUNTER — Telehealth: Payer: Self-pay | Admitting: Cardiovascular Disease

## 2012-06-20 ENCOUNTER — Telehealth: Payer: Self-pay | Admitting: Cardiovascular Disease

## 2012-06-20 NOTE — Telephone Encounter (Signed)
Spoke With [facility] # [phone] asked her if I could  Get Documentation of how this Pt passed so I can get the D/C Signed, she is Going to Fax The Death Summary Over.  2012-07-07/KM

## 2012-06-20 NOTE — Telephone Encounter (Signed)
Original Death Certificate Received From [facility] Will Hold Onto Until Lauren/Cooper Return To Office On Thursday 06/21/12 To See if He Will Sign .  06/20/12/KM

## 2012-06-22 ENCOUNTER — Telehealth: Payer: Self-pay | Admitting: Cardiovascular Disease

## 2012-06-22 NOTE — Telephone Encounter (Signed)
Spoke w/ [facility] Their Aware D/C ready For P/U 06/22/12/KM

## 2012-06-22 NOTE — Telephone Encounter (Signed)
Dr.Cooper Signed Death Certificate, I called [facility] [employer] Their Phone Not Working I faxed D/C over to [facility] W/ Note Attached stating their Phone Not Working. I have Original Copy Od D/C waiting For Pick up 06/22/12/KM

## 2012-06-23 DEATH — deceased

## 2012-06-25 ENCOUNTER — Telehealth: Payer: Self-pay | Admitting: Cardiovascular Disease

## 2012-06-25 NOTE — Telephone Encounter (Signed)
Death Certificate Picked up 06/25/12/KM

## 2013-12-05 NOTE — Telephone Encounter (Signed)
Close Encounter 

## 2014-05-01 ENCOUNTER — Encounter (HOSPITAL_COMMUNITY): Payer: Self-pay | Admitting: Cardiovascular Disease
